# Patient Record
Sex: Female | Born: 1950 | Race: White | Hispanic: No | Marital: Married | Smoking: Never smoker
Health system: Southern US, Community
[De-identification: ages and names within clinical notes are randomized; demographics above are authoritative.]

## PROBLEM LIST (undated history)

## (undated) DIAGNOSIS — Z9889 Other specified postprocedural states: Secondary | ICD-10-CM

## (undated) DIAGNOSIS — E785 Hyperlipidemia, unspecified: Secondary | ICD-10-CM

## (undated) DIAGNOSIS — Z974 Presence of external hearing-aid: Secondary | ICD-10-CM

## (undated) DIAGNOSIS — C50111 Malignant neoplasm of central portion of right female breast: Principal | ICD-10-CM

## (undated) DIAGNOSIS — J302 Other seasonal allergic rhinitis: Secondary | ICD-10-CM

## (undated) DIAGNOSIS — Z973 Presence of spectacles and contact lenses: Secondary | ICD-10-CM

## (undated) DIAGNOSIS — K219 Gastro-esophageal reflux disease without esophagitis: Secondary | ICD-10-CM

## (undated) DIAGNOSIS — F329 Major depressive disorder, single episode, unspecified: Secondary | ICD-10-CM

## (undated) DIAGNOSIS — Z9289 Personal history of other medical treatment: Secondary | ICD-10-CM

## (undated) DIAGNOSIS — C50919 Malignant neoplasm of unspecified site of unspecified female breast: Secondary | ICD-10-CM

## (undated) DIAGNOSIS — M8580 Other specified disorders of bone density and structure, unspecified site: Secondary | ICD-10-CM

## (undated) DIAGNOSIS — F32A Depression, unspecified: Secondary | ICD-10-CM

## (undated) HISTORY — PX: TONSILLECTOMY: SUR1361

## (undated) HISTORY — PX: HAND SURGERY: SHX662

## (undated) HISTORY — DX: Other specified postprocedural states: Z98.890

## (undated) HISTORY — PX: ABDOMINAL HYSTERECTOMY: SHX81

## (undated) HISTORY — DX: Personal history of other medical treatment: Z92.89

## (undated) HISTORY — PX: WISDOM TOOTH EXTRACTION: SHX21

## (undated) HISTORY — DX: Malignant neoplasm of unspecified site of unspecified female breast: C50.919

## (undated) HISTORY — DX: Malignant neoplasm of central portion of right female breast: C50.111

## (undated) HISTORY — PX: FOOT SURGERY: SHX648

## (undated) HISTORY — PX: TUBAL LIGATION: SHX77

## (undated) HISTORY — PX: COLONOSCOPY: SHX174

---

## 1991-08-10 DIAGNOSIS — Z9289 Personal history of other medical treatment: Secondary | ICD-10-CM

## 1991-08-10 HISTORY — DX: Personal history of other medical treatment: Z92.89

## 2000-06-20 ENCOUNTER — Other Ambulatory Visit: Admission: RE | Admit: 2000-06-20 | Discharge: 2000-06-20 | Payer: Self-pay | Admitting: Obstetrics and Gynecology

## 2000-09-26 ENCOUNTER — Other Ambulatory Visit: Admission: RE | Admit: 2000-09-26 | Discharge: 2000-09-26 | Payer: Self-pay | Admitting: Obstetrics and Gynecology

## 2000-11-08 ENCOUNTER — Other Ambulatory Visit: Admission: RE | Admit: 2000-11-08 | Discharge: 2000-11-08 | Payer: Self-pay | Admitting: Obstetrics and Gynecology

## 2001-06-19 ENCOUNTER — Other Ambulatory Visit: Admission: RE | Admit: 2001-06-19 | Discharge: 2001-06-19 | Payer: Self-pay | Admitting: Obstetrics and Gynecology

## 2001-11-27 ENCOUNTER — Other Ambulatory Visit: Admission: RE | Admit: 2001-11-27 | Discharge: 2001-11-27 | Payer: Self-pay | Admitting: *Deleted

## 2002-06-22 ENCOUNTER — Other Ambulatory Visit: Admission: RE | Admit: 2002-06-22 | Discharge: 2002-06-22 | Payer: Self-pay | Admitting: Obstetrics and Gynecology

## 2003-06-28 ENCOUNTER — Other Ambulatory Visit: Admission: RE | Admit: 2003-06-28 | Discharge: 2003-06-28 | Payer: Self-pay | Admitting: Obstetrics and Gynecology

## 2004-06-29 ENCOUNTER — Other Ambulatory Visit: Admission: RE | Admit: 2004-06-29 | Discharge: 2004-06-29 | Payer: Self-pay | Admitting: Obstetrics and Gynecology

## 2004-09-28 ENCOUNTER — Ambulatory Visit (HOSPITAL_COMMUNITY): Admission: RE | Admit: 2004-09-28 | Discharge: 2004-09-28 | Payer: Self-pay | Admitting: Gastroenterology

## 2005-07-14 ENCOUNTER — Other Ambulatory Visit: Admission: RE | Admit: 2005-07-14 | Discharge: 2005-07-14 | Payer: Self-pay | Admitting: Obstetrics and Gynecology

## 2005-12-27 ENCOUNTER — Emergency Department: Payer: Self-pay | Admitting: Emergency Medicine

## 2005-12-27 ENCOUNTER — Other Ambulatory Visit: Payer: Self-pay

## 2006-08-10 ENCOUNTER — Other Ambulatory Visit: Admission: RE | Admit: 2006-08-10 | Discharge: 2006-08-10 | Payer: Self-pay | Admitting: Obstetrics and Gynecology

## 2007-05-11 IMAGING — CR DG CHEST 1V PORT
1 series · 1 of 1 positions shown · non-contrast
Comparison: none

REASON FOR EXAM: Chest pain.  [HOSPITAL]
COMMENTS:

PROCEDURE:     DXR - DXR PORTABLE CHEST SINGLE VIEW  - December 27, 2005  [DATE]
RESULT:     AP view of the chest shows the lung fields to be clear.  The
heart, mediastinal, and osseous structures show no significant abnormalities.

[view not recorded]
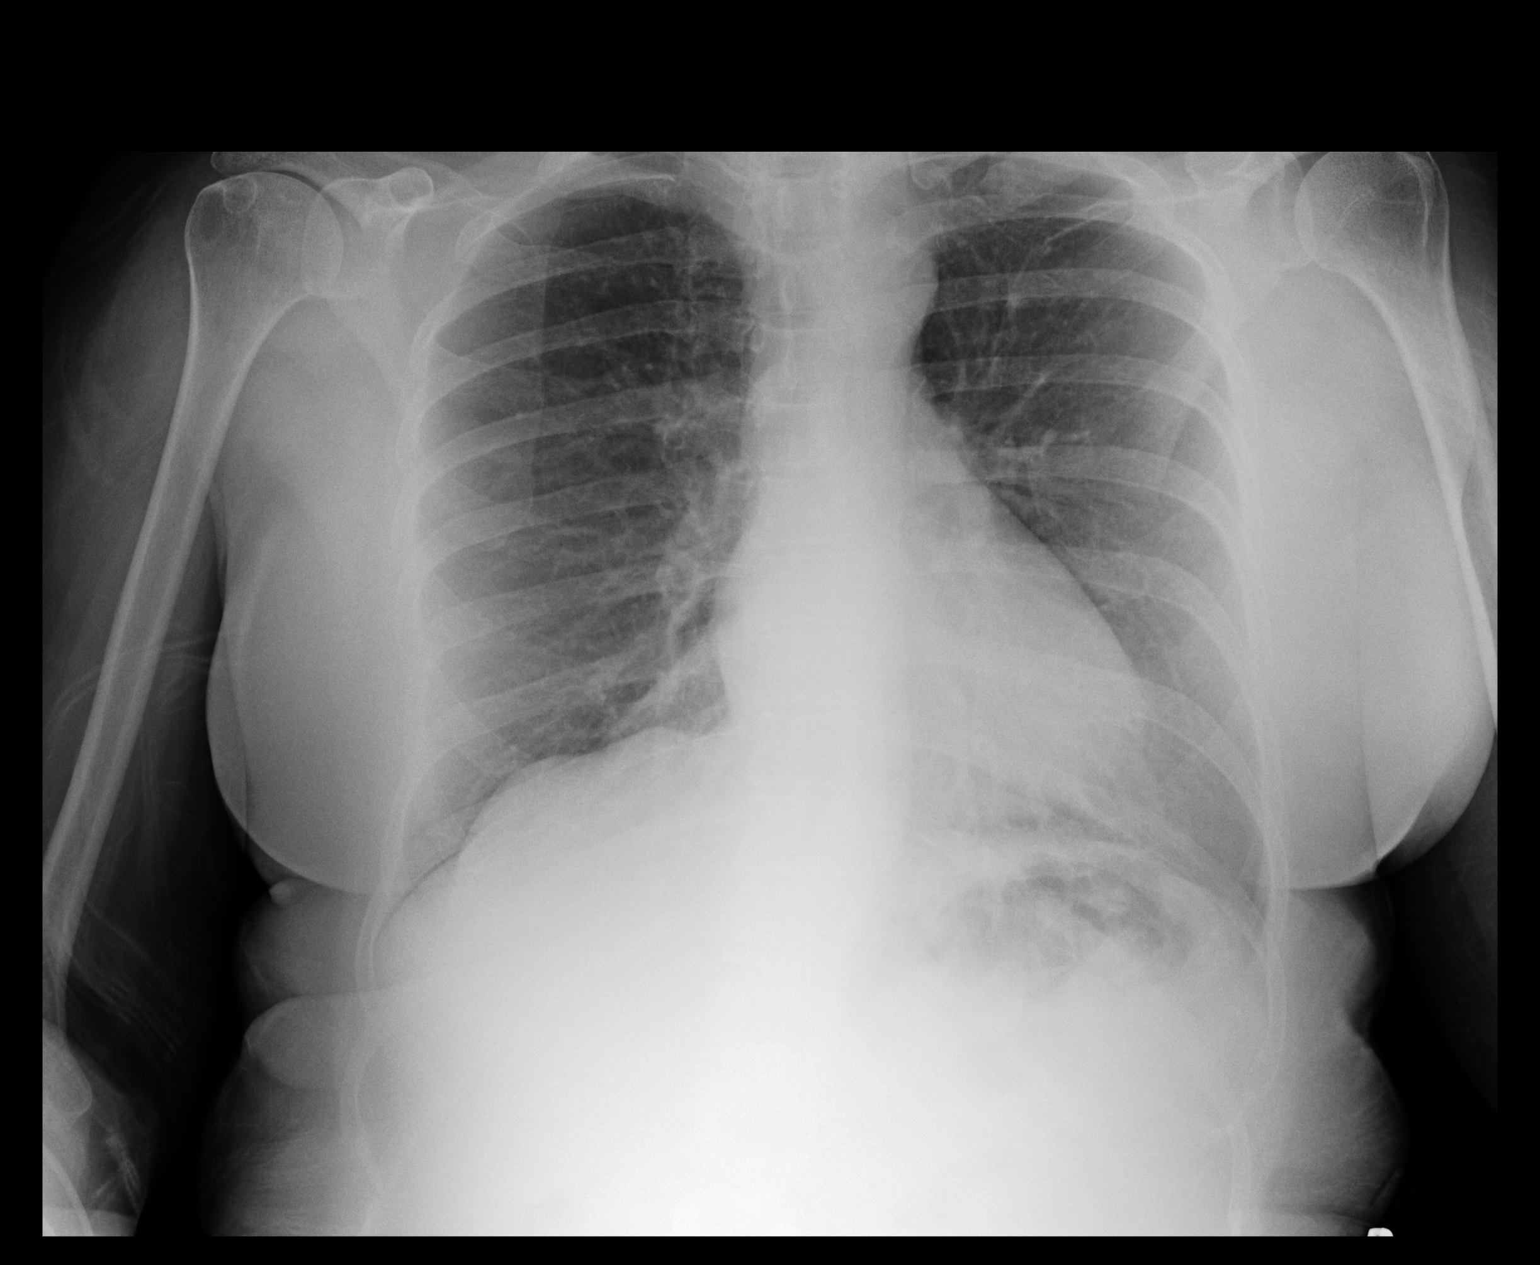

[1 of 1 positions shown; findings below may reference images not displayed]

IMPRESSION: No acute changes are identified.

## 2008-02-12 ENCOUNTER — Ambulatory Visit: Payer: Self-pay | Admitting: Chiropractor

## 2010-08-09 DIAGNOSIS — Z9889 Other specified postprocedural states: Secondary | ICD-10-CM

## 2010-08-09 HISTORY — DX: Other specified postprocedural states: Z98.890

## 2014-05-09 ENCOUNTER — Ambulatory Visit (INDEPENDENT_AMBULATORY_CARE_PROVIDER_SITE_OTHER): Payer: Self-pay | Admitting: Surgery

## 2014-05-09 NOTE — H&P (Signed)
Melody Harding 05/09/2014 11:21 AM Location: [facility] Patient #: [id number] DOB: Nov 22, 1950 Married / Language: Undefined / Race: Undefined Female History of Present Illness [name] A. Jesse Nosbisch MD; 05/09/2014 12:13 PM) Patient words: pre-op breast  Patient sent at the request of Dr. Victorino Sparrow left nipple discharge for one month. Discharge is clear. It only occurs at night. Denies breast pain or mass.No previous history of breast trouble  mammogram and U/S and ductogram negative.  The patient is a 63 year old female who presents with a complaint of nipple discharge. The onset of the nipple discharge has been gradual and has been occurring in an intermittent pattern for 3 weeks. The course has been constant. The nipple discharge is characterized as clear. There are no changes to the nipple. Other Problems (Dahionnarah [place], [place]; 05/09/2014 11:21 AM) Back Pain Depression Gastroesophageal Reflux Disease Hemorrhoids Hypercholesterolemia  Past Surgical History (Dahionnarah [place], RMA; 05/09/2014 11:21 AM) Colon Polyp Removal - Colonoscopy Foot Surgery Bilateral. Hysterectomy (not due to cancer) - Partial Oral Surgery Tonsillectomy  Diagnostic Studies History Alexander Bergeron [place], [place]; 05/09/2014 11:21 AM) Colonoscopy 1-5 years ago Mammogram within last year Pap Smear >5 years ago  Medication History ([place], [place]; 05/09/2014 11:25 AM) ZyrTEC Allergy (10MG  Capsule, Oral) Active. ZyrTEC-D Allergy & Congestion (5-120MG  Tablet ER 12HR, Oral) Active. Singulair (10MG  Tablet, Oral) Active. NexIUM (40MG  Packet, Oral) Active. Lexapro (20MG  Tablet, Oral) Active. Crestor (20MG  Tablet, Oral) Active.  Social History (Dahionnarah [place], [place]; 05/09/2014 11:21 AM) Alcohol use Occasional alcohol use. Caffeine use Tea. No drug use Tobacco use Never smoker.  Family History Alexander Bergeron [place], [place]; 05/09/2014 11:21  AM) Depression Father. Diabetes Mellitus Brother. Heart Disease Father. Malignant Neoplasm Of Pancreas Mother.  Pregnancy / Birth History Alexander Bergeron [place], [place]; 05/09/2014 11:21 AM) Age at menarche 40 years. Age of menopause 83-50 Contraceptive History Oral contraceptives. Gravida 1 Maternal age 34-30 Para 1     Review of Systems ([place] RMA; 05/09/2014 11:21 AM) General Not Present- Appetite Loss, Chills, Fatigue, Fever, Night Sweats, Weight Gain and Weight Loss. Skin Not Present- Change in Wart/Mole, Dryness, Hives, Jaundice, New Lesions, Non-Healing Wounds, Rash and Ulcer. HEENT Present- Hearing Loss, Seasonal Allergies and Wears glasses/contact lenses. Not Present- Earache, Hoarseness, Nose Bleed, Oral Ulcers, Ringing in the Ears, Sinus Pain, Sore Throat, Visual Disturbances and Yellow Eyes. Respiratory Present- Snoring. Not Present- Bloody sputum, Chronic Cough, Difficulty Breathing and Wheezing. Breast Present- Nipple Discharge. Not Present- Breast Mass, Breast Pain and Skin Changes. Cardiovascular Present- Leg Cramps. Not Present- Chest Pain, Difficulty Breathing Lying Down, Palpitations, Rapid Heart Rate, Shortness of Breath and Swelling of Extremities. Gastrointestinal Present- Indigestion. Not Present- Abdominal Pain, Bloating, Bloody Stool, Change in Bowel Habits, Chronic diarrhea, Constipation, Difficulty Swallowing, Excessive gas, Gets full quickly at meals, Hemorrhoids, Nausea, Rectal Pain and Vomiting. Female Genitourinary Not Present- Frequency, Nocturia, Painful Urination, Pelvic Pain and Urgency. Musculoskeletal Present- Back Pain. Not Present- Joint Pain, Joint Stiffness, Muscle Pain, Muscle Weakness and Swelling of Extremities. Neurological Not Present- Decreased Memory, Fainting, Headaches, Numbness, Seizures, Tingling, Tremor, Trouble walking and Weakness. Psychiatric Present- Depression. Not Present- Anxiety, Bipolar, Change in Sleep  Pattern, Fearful and Frequent crying. Endocrine Not Present- Cold Intolerance, Excessive Hunger, Hair Changes, Heat Intolerance, Hot flashes and New Diabetes. Hematology Not Present- Easy Bruising, Excessive bleeding, Gland problems, HIV and Persistent Infections.  Vitals ([name] RMA; 05/09/2014 11:23 AM) 05/09/2014 11:21 AM Weight: 178.8 lb Height: 60in Body Surface Area: 1.85 m Body Mass Index: 34.92 kg/m Temp.: 98.67F(Oral)  Pulse:  80 (Regular)  BP: 138/76 (Sitting, Left Arm, Standard)     Physical Exam (Harryette Shuart A. Sharnelle Cappelli MD; 05/09/2014 12:12 PM)  General Mental Status-Alert. General Appearance-Consistent with stated age. Hydration-Well hydrated. Voice-Normal.  Head and Neck Head-normocephalic, atraumatic with no lesions or palpable masses.  Eye Eyeball - Bilateral-Extraocular movements intact. Sclera/Conjunctiva - Bilateral-No scleral icterus.  Chest and Lung Exam Chest and lung exam reveals -quiet, even and easy respiratory effort with no use of accessory muscles and on auscultation, normal breath sounds, no adventitious sounds and normal vocal resonance. Inspection Chest Wall - Normal. Back - normal. Note: clear nipple dischrge. No masses bilaterally. dense breast tissue bilaterally right without discharge    Breast Nipples Discharge - Left - Scant and Watery. Right - None.  Cardiovascular Cardiovascular examination reveals -on palpation PMI is normal in location and amplitude, no palpable S3 or S4. Normal cardiac borders., normal heart sounds, regular rate and rhythm with no murmurs, carotid auscultation reveals no bruits and normal pedal pulses bilaterally.  Neurologic Neurologic evaluation reveals -alert and oriented x 3 with no impairment of recent or remote memory. Mental Status-Normal.  Musculoskeletal Normal Exam - Left-Upper Extremity Strength Normal and Lower Extremity Strength Normal. Normal Exam -  Right-Upper Extremity Strength Normal, Lower Extremity Weakness.    Assessment & Plan (Jenita Rayfield A. Edoardo Laforte MD; 05/09/2014 12:14 PM)  DISCHARGE FROM LEFT NIPPLE (611.79  N64.52) Impression: discussed central duct exciosion vs observation. Risk of malignancy is 3 % or less. pt wishes to proceed. Risk of lumpectomy include bleeding, infection, seroma, more surgery, use of seed/wire, wound care, cosmetic deformity and the need for other treatments, death , blood clots, death. Pt agrees to proceed.

## 2014-06-14 ENCOUNTER — Encounter (HOSPITAL_BASED_OUTPATIENT_CLINIC_OR_DEPARTMENT_OTHER): Payer: Self-pay | Admitting: *Deleted

## 2014-06-14 NOTE — Progress Notes (Signed)
Called pcp for labs done today-go [facility]-so does not need to come in for [facility] labs

## 2014-06-17 DIAGNOSIS — F32A Depression, unspecified: Secondary | ICD-10-CM | POA: Insufficient documentation

## 2014-06-20 ENCOUNTER — Ambulatory Visit (HOSPITAL_BASED_OUTPATIENT_CLINIC_OR_DEPARTMENT_OTHER)
Admission: RE | Admit: 2014-06-20 | Discharge: 2014-06-20 | Disposition: A | Discharge status: Home / Self Care | Payer: BC Managed Care – PPO | Source: Ambulatory Visit | Attending: Surgery | Admitting: Surgery

## 2014-06-20 ENCOUNTER — Encounter (HOSPITAL_BASED_OUTPATIENT_CLINIC_OR_DEPARTMENT_OTHER): Payer: Self-pay | Admitting: Anesthesiology

## 2014-06-20 ENCOUNTER — Encounter (HOSPITAL_BASED_OUTPATIENT_CLINIC_OR_DEPARTMENT_OTHER)
Admission: RE | Disposition: A | Discharge status: Home / Self Care | Payer: Self-pay | Source: Ambulatory Visit | Attending: Surgery

## 2014-06-20 ENCOUNTER — Ambulatory Visit (HOSPITAL_BASED_OUTPATIENT_CLINIC_OR_DEPARTMENT_OTHER): Payer: BC Managed Care – PPO | Admitting: Anesthesiology

## 2014-06-20 DIAGNOSIS — F329 Major depressive disorder, single episode, unspecified: Secondary | ICD-10-CM | POA: Diagnosis not present

## 2014-06-20 DIAGNOSIS — Z79899 Other long term (current) drug therapy: Secondary | ICD-10-CM | POA: Insufficient documentation

## 2014-06-20 DIAGNOSIS — N6452 Nipple discharge: Secondary | ICD-10-CM | POA: Insufficient documentation

## 2014-06-20 DIAGNOSIS — E780 Pure hypercholesterolemia: Secondary | ICD-10-CM | POA: Diagnosis not present

## 2014-06-20 DIAGNOSIS — K219 Gastro-esophageal reflux disease without esophagitis: Secondary | ICD-10-CM | POA: Insufficient documentation

## 2014-06-20 HISTORY — DX: Other seasonal allergic rhinitis: J30.2

## 2014-06-20 HISTORY — DX: Hyperlipidemia, unspecified: E78.5

## 2014-06-20 HISTORY — DX: Presence of external hearing-aid: Z97.4

## 2014-06-20 HISTORY — DX: Gastro-esophageal reflux disease without esophagitis: K21.9

## 2014-06-20 HISTORY — DX: Presence of spectacles and contact lenses: Z97.3

## 2014-06-20 HISTORY — DX: Major depressive disorder, single episode, unspecified: F32.9

## 2014-06-20 HISTORY — PX: BREAST DUCTAL SYSTEM EXCISION: SHX5242

## 2014-06-20 HISTORY — DX: Depression, unspecified: F32.A

## 2014-06-20 SURGERY — EXCISION DUCTAL SYSTEM BREAST
Anesthesia: General | Site: Breast | Laterality: Left

## 2014-06-20 MED ORDER — HYDROMORPHONE HCL 1 MG/ML IJ SOLN: 0.2500 mg | INTRAMUSCULAR | Status: DC | PRN

## 2014-06-20 MED ORDER — LIDOCAINE HCL (CARDIAC) 20 MG/ML IV SOLN
INTRAVENOUS | Status: DC | PRN
  Administered 2014-06-20: 50 mg via INTRAVENOUS

## 2014-06-20 MED ORDER — FENTANYL CITRATE 0.05 MG/ML IJ SOLN: 50.0000 ug | INTRAMUSCULAR | Status: DC | PRN

## 2014-06-20 MED ORDER — HYDROCODONE-ACETAMINOPHEN 5-325 MG PO TABS: 1.0000 | ORAL_TABLET | Freq: Four times a day (QID) | ORAL | Status: DC | PRN

## 2014-06-20 MED ORDER — EPHEDRINE SULFATE 50 MG/ML IJ SOLN
INTRAMUSCULAR | Status: DC | PRN
  Administered 2014-06-20: 10 mg via INTRAVENOUS

## 2014-06-20 MED ORDER — SCOPOLAMINE 1 MG/3DAYS TD PT72
1.0000 | MEDICATED_PATCH | TRANSDERMAL | Status: DC
  Administered 2014-06-20: 1.5 mg via TRANSDERMAL

## 2014-06-20 MED ORDER — OXYCODONE HCL 5 MG/5ML PO SOLN: 5.0000 mg | Freq: Once | ORAL | Status: DC | PRN

## 2014-06-20 MED ORDER — LACTATED RINGERS IV SOLN
INTRAVENOUS | Status: DC
  Administered 2014-06-20 (×2): via INTRAVENOUS

## 2014-06-20 MED ORDER — PROPOFOL 10 MG/ML IV BOLUS
INTRAVENOUS | Status: DC | PRN
  Administered 2014-06-20: 200 mg via INTRAVENOUS

## 2014-06-20 MED ORDER — MIDAZOLAM HCL 5 MG/5ML IJ SOLN
INTRAMUSCULAR | Status: DC | PRN
  Administered 2014-06-20: 2 mg via INTRAVENOUS

## 2014-06-20 MED ORDER — OXYCODONE HCL 5 MG PO TABS: 5.0000 mg | ORAL_TABLET | Freq: Once | ORAL | Status: DC | PRN

## 2014-06-20 MED ORDER — MIDAZOLAM HCL 2 MG/2ML IJ SOLN
INTRAMUSCULAR | Status: AC
  Filled 2014-06-20: qty 2

## 2014-06-20 MED ORDER — ONDANSETRON HCL 4 MG/2ML IJ SOLN: 4.0000 mg | Freq: Once | INTRAMUSCULAR | Status: DC | PRN

## 2014-06-20 MED ORDER — FENTANYL CITRATE 0.05 MG/ML IJ SOLN
INTRAMUSCULAR | Status: AC
  Filled 2014-06-20: qty 4

## 2014-06-20 MED ORDER — FENTANYL CITRATE 0.05 MG/ML IJ SOLN
INTRAMUSCULAR | Status: DC | PRN
  Administered 2014-06-20: 50 ug via INTRAVENOUS

## 2014-06-20 MED ORDER — ONDANSETRON HCL 4 MG/2ML IJ SOLN
INTRAMUSCULAR | Status: DC | PRN
  Administered 2014-06-20: 4 mg via INTRAVENOUS

## 2014-06-20 MED ORDER — DEXAMETHASONE SODIUM PHOSPHATE 4 MG/ML IJ SOLN
INTRAMUSCULAR | Status: DC | PRN
  Administered 2014-06-20: 10 mg via INTRAVENOUS

## 2014-06-20 MED ORDER — BUPIVACAINE-EPINEPHRINE (PF) 0.25% -1:200000 IJ SOLN
INTRAMUSCULAR | Status: AC
  Filled 2014-06-20: qty 30

## 2014-06-20 MED ORDER — SCOPOLAMINE 1 MG/3DAYS TD PT72
MEDICATED_PATCH | TRANSDERMAL | Status: AC
  Filled 2014-06-20: qty 1

## 2014-06-20 MED ORDER — BUPIVACAINE-EPINEPHRINE 0.25% -1:200000 IJ SOLN
INTRAMUSCULAR | Status: DC | PRN
  Administered 2014-06-20: 20 mL

## 2014-06-20 MED ORDER — MIDAZOLAM HCL 2 MG/2ML IJ SOLN: 1.0000 mg | INTRAMUSCULAR | Status: DC | PRN

## 2014-06-20 MED ORDER — CEFAZOLIN SODIUM-DEXTROSE 2-3 GM-% IV SOLR
INTRAVENOUS | Status: AC
  Filled 2014-06-20: qty 50

## 2014-06-20 MED ORDER — DEXTROSE 5 % IV SOLN
3.0000 g | INTRAVENOUS | Status: AC
  Administered 2014-06-20: 2 g via INTRAVENOUS

## 2014-06-20 MED ORDER — CHLORHEXIDINE GLUCONATE 4 % EX LIQD: 1.0000 "application " | Freq: Once | CUTANEOUS | Status: DC

## 2014-06-20 MED ORDER — CEFAZOLIN SODIUM 1-5 GM-% IV SOLN
INTRAVENOUS | Status: AC
  Filled 2014-06-20: qty 50

## 2014-06-20 SURGICAL SUPPLY — 41 items
BLADE SURG 15 STRL LF DISP TIS (BLADE) ×1 IMPLANT
BLADE SURG 15 STRL SS (BLADE) ×2
CANISTER SUCT 1200ML W/VALVE (MISCELLANEOUS) ×2 IMPLANT
CHLORAPREP W/TINT 26ML (MISCELLANEOUS) ×2 IMPLANT
CLIP TI WIDE RED SMALL 6 (CLIP) IMPLANT
COVER BACK TABLE 60X90IN (DRAPES) ×2 IMPLANT
COVER MAYO STAND STRL (DRAPES) ×2 IMPLANT
DECANTER SPIKE VIAL GLASS SM (MISCELLANEOUS) ×1 IMPLANT
DEVICE DUBIN W/COMP PLATE 8390 (MISCELLANEOUS) IMPLANT
DRAPE LAPAROSCOPIC ABDOMINAL (DRAPES) IMPLANT
DRAPE PED LAPAROTOMY (DRAPES) ×2 IMPLANT
DRAPE UTILITY XL STRL (DRAPES) ×2 IMPLANT
ELECT COATED BLADE 2.86 ST (ELECTRODE) ×2 IMPLANT
ELECT REM PT RETURN 9FT ADLT (ELECTROSURGICAL) ×2
ELECTRODE REM PT RTRN 9FT ADLT (ELECTROSURGICAL) ×1 IMPLANT
GLOVE BIOGEL PI IND STRL 7.0 (GLOVE) IMPLANT
GLOVE BIOGEL PI IND STRL 8 (GLOVE) ×1 IMPLANT
GLOVE BIOGEL PI INDICATOR 7.0 (GLOVE) ×1
GLOVE BIOGEL PI INDICATOR 8 (GLOVE) ×1
GLOVE ECLIPSE 6.5 STRL STRAW (GLOVE) ×1 IMPLANT
GLOVE ECLIPSE 8.0 STRL XLNG CF (GLOVE) ×2 IMPLANT
GOWN STRL REUS W/ TWL LRG LVL3 (GOWN DISPOSABLE) ×2 IMPLANT
GOWN STRL REUS W/TWL LRG LVL3 (GOWN DISPOSABLE) ×4
LIQUID BAND (GAUZE/BANDAGES/DRESSINGS) ×2 IMPLANT
NDL HYPO 25X1 1.5 SAFETY (NEEDLE) ×1 IMPLANT
NEEDLE HYPO 25X1 1.5 SAFETY (NEEDLE) ×2 IMPLANT
NS IRRIG 1000ML POUR BTL (IV SOLUTION) ×2 IMPLANT
PACK BASIN DAY SURGERY FS (CUSTOM PROCEDURE TRAY) ×2 IMPLANT
PENCIL BUTTON HOLSTER BLD 10FT (ELECTRODE) ×2 IMPLANT
SLEEVE SCD COMPRESS KNEE MED (MISCELLANEOUS) ×2 IMPLANT
SPONGE LAP 4X18 X RAY DECT (DISPOSABLE) ×2 IMPLANT
STAPLER VISISTAT 35W (STAPLE) IMPLANT
SUT MON AB 4-0 PC3 18 (SUTURE) ×2 IMPLANT
SUT SILK 2 0 SH (SUTURE) IMPLANT
SUT VIC AB 3-0 SH 27 (SUTURE) ×4
SUT VIC AB 3-0 SH 27X BRD (SUTURE) ×1 IMPLANT
SYR CONTROL 10ML LL (SYRINGE) ×2 IMPLANT
TOWEL OR 17X24 6PK STRL BLUE (TOWEL DISPOSABLE) ×4 IMPLANT
TOWEL OR NON WOVEN STRL DISP B (DISPOSABLE) ×2 IMPLANT
TUBE CONNECTING 20X1/4 (TUBING) ×2 IMPLANT
YANKAUER SUCT BULB TIP NO VENT (SUCTIONS) ×2 IMPLANT

## 2014-06-20 NOTE — Anesthesia Postprocedure Evaluation (Signed)
  Anesthesia Post-op Note  Patient: Melody Harding  Procedure(s) Performed: Procedure(s): LEFT BREAST CENTRAL DUCT EXCISION (Left)  Patient Location: PACU  Anesthesia Type: General   Level of Consciousness: awake, alert  and oriented  Airway and Oxygen Therapy: Patient Spontanous Breathing  Post-op Pain: mild  Post-op Assessment: Post-op Vital signs reviewed  Post-op Vital Signs: Reviewed  Last Vitals:  Filed Vitals:   06/20/14 1145  BP: 157/88  Pulse: 71  Temp:   Resp: 12    Complications: No apparent anesthesia complications

## 2014-06-20 NOTE — Discharge Instructions (Signed)
[facility] °Office Phone Number [phone] ° °BREAST BIOPSY/ PARTIAL MASTECTOMY: POST OP INSTRUCTIONS ° °Always review your discharge instruction sheet given to you by the facility where your surgery was performed. ° °IF YOU HAVE DISABILITY OR FAMILY LEAVE FORMS, YOU MUST BRING THEM TO THE OFFICE FOR PROCESSING.  DO NOT GIVE THEM TO YOUR DOCTOR. ° °1. A prescription for pain medication may be given to you upon discharge.  Take your pain medication as prescribed, if needed.  If narcotic pain medicine is not needed, then you may take acetaminophen (Tylenol) or ibuprofen (Advil) as needed. °2. Take your usually prescribed medications unless otherwise directed °3. If you need a refill on your pain medication, please contact your pharmacy.  They will contact our office to request authorization.  Prescriptions will not be filled after 5pm or on week-ends. °4. You should eat very light the first 24 hours after surgery, such as soup, crackers, pudding, etc.  Resume your normal diet the day after surgery. °5. Most patients will experience some swelling and bruising in the breast.  Ice packs and a good support bra will help.  Swelling and bruising can take several days to resolve.  °6. It is common to experience some constipation if taking pain medication after surgery.  Increasing fluid intake and taking a stool softener will usually help or prevent this problem from occurring.  A mild laxative (Milk of Magnesia or Miralax) should be taken according to package directions if there are no bowel movements after 48 hours. °7. Unless discharge instructions indicate otherwise, you may remove your bandages 24-48 hours after surgery, and you may shower at that time.  You may have steri-strips (small skin tapes) in place directly over the incision.  These strips should be left on the skin for 7-10 days.  If your surgeon used skin glue on the incision, you may shower in 24 hours.  The glue will flake off over the  next 2-3 weeks.  Any sutures or staples will be removed at the office during your follow-up visit. °8. ACTIVITIES:  You may resume regular daily activities (gradually increasing) beginning the next day.  Wearing a good support bra or sports bra minimizes pain and swelling.  You may have sexual intercourse when it is comfortable. °a. You may drive when you no longer are taking prescription pain medication, you can comfortably wear a seatbelt, and you can safely maneuver your car and apply brakes. °b. RETURN TO WORK:  ______________________________________________________________________________________ °9. You should see your doctor in the office for a follow-up appointment approximately two weeks after your surgery.  Your doctor’s nurse will typically make your follow-up appointment when she calls you with your pathology report.  Expect your pathology report 2-3 business days after your surgery.  You may call to check if you do not hear from us after three days. °10. OTHER INSTRUCTIONS: _______________________________________________________________________________________________ _____________________________________________________________________________________________________________________________________ °_____________________________________________________________________________________________________________________________________ °_____________________________________________________________________________________________________________________________________ ° °WHEN TO CALL YOUR DOCTOR: °1. Fever over 101.0 °2. Nausea and/or vomiting. °3. Extreme swelling or bruising. °4. Continued bleeding from incision. °5. Increased pain, redness, or drainage from the incision. ° °The clinic staff is available to answer your questions during regular business hours.  Please don’t hesitate to call and ask to speak to one of the nurses for clinical concerns.  If you have a medical emergency, go to the nearest  emergency room or call 911.  A surgeon from [facility] is always on call at the hospital. ° °For further questions, please visit [email or web address]  ° ° °  Post Anesthesia Home Care Instructions ° °Activity: °Get plenty of rest for the remainder of the day. A responsible adult should stay with you for 24 hours following the procedure.  °For the next 24 hours, DO NOT: °-Drive a car °-Operate machinery °-Drink alcoholic beverages °-Take any medication unless instructed by your physician °-Make any legal decisions or sign important papers. ° °Meals: °Start with liquid foods such as gelatin or soup. Progress to regular foods as tolerated. Avoid greasy, spicy, heavy foods. If nausea and/or vomiting occur, drink only clear liquids until the nausea and/or vomiting subsides. Call your physician if vomiting continues. ° °Special Instructions/Symptoms: °Your throat may feel dry or sore from the anesthesia or the breathing tube placed in your throat during surgery. If this causes discomfort, gargle with warm salt water. The discomfort should disappear within 24 hours. ° °

## 2014-06-20 NOTE — Transfer of Care (Signed)
Immediate Anesthesia Transfer of Care Note  Patient: Melody Harding  Procedure(s) Performed: Procedure(s): LEFT BREAST CENTRAL DUCT EXCISION (Left)  Patient Location: PACU  Anesthesia Type:General  Level of Consciousness: sedated  Airway & Oxygen Therapy: Patient Spontanous Breathing and Patient connected to face mask oxygen  Post-op Assessment: Report given to PACU RN and Post -op Vital signs reviewed and stable  Post vital signs: Reviewed and stable  Complications: No apparent anesthesia complications

## 2014-06-20 NOTE — Interval H&P Note (Signed)
History and Physical Interval Note:  06/20/2014 10:07 AM  Melody Harding  has presented today for surgery, with the diagnosis of Nipple Discharge  The various methods of treatment have been discussed with the patient and family. After consideration of risks, benefits and other options for treatment, the patient has consented to  Procedure(s): LEFT BREAST CENTRAL DUCT EXCISION (Left) as a surgical intervention .  The patient's history has been reviewed, patient examined, no change in status, stable for surgery.  I have reviewed the patient's chart and labs.  Questions were answered to the patient's satisfaction.     Ole Lafon A.

## 2014-06-20 NOTE — Anesthesia Procedure Notes (Signed)
Procedure Name: LMA Insertion Date/Time: 06/20/2014 10:34 AM Performed by: Lieutenant Diego Pre-anesthesia Checklist: Patient identified, Emergency Drugs available, Suction available and Patient being monitored Patient Re-evaluated:Patient Re-evaluated prior to inductionOxygen Delivery Method: Circle System Utilized Preoxygenation: Pre-oxygenation with 100% oxygen Intubation Type: IV induction Ventilation: Mask ventilation without difficulty LMA: LMA inserted LMA Size: 4.0 Number of attempts: 1 Airway Equipment and Method: bite block Placement Confirmation: positive ETCO2 and breath sounds checked- equal and bilateral Tube secured with: Tape Dental Injury: Teeth and Oropharynx as per pre-operative assessment

## 2014-06-20 NOTE — Anesthesia Preprocedure Evaluation (Signed)
Anesthesia Evaluation  Patient identified by MRN, date of birth, ID band Patient awake    Reviewed: Allergy & Precautions, H&P , NPO status , Patient's Chart, lab work & pertinent test results  Airway Mallampati: II  TM Distance: >3 FB Neck ROM: Full    Dental  (+) Teeth Intact, Dental Advisory Given   Pulmonary  breath sounds clear to auscultation        Cardiovascular Rhythm:Regular Rate:Normal     Neuro/Psych    GI/Hepatic GERD-  Medicated and Controlled,  Endo/Other  Morbid obesity  Renal/GU      Musculoskeletal   Abdominal   Peds  Hematology   Anesthesia Other Findings   Reproductive/Obstetrics                             Anesthesia Physical Anesthesia Plan  ASA: II  Anesthesia Plan: General   Post-op Pain Management:    Induction: Intravenous  Airway Management Planned: LMA  Additional Equipment:   Intra-op Plan:   Post-operative Plan: Extubation in OR  Informed Consent: I have reviewed the patients History and Physical, chart, labs and discussed the procedure including the risks, benefits and alternatives for the proposed anesthesia with the patient or authorized representative who has indicated his/her understanding and acceptance.   Dental advisory given  Plan Discussed with: CRNA, Anesthesiologist and Surgeon  Anesthesia Plan Comments:         Anesthesia Quick Evaluation

## 2014-06-20 NOTE — Op Note (Signed)
Preop diagnosis: left breast  nipple discharge  Postop diagnosis: Same  Procedure: Central breast duct excision left   Surgeon: Erroll Luna M.D.  Anesthesia: Gen. LMA l with local anesthesia 0.25 % SENSORICAINE WITH EPINEPHRINE   EBL: Minimal  Specimen:  LEFT Breast tissue to pathology  Drains: None  Indications for procedure: The patient presents for central duct excision of the breast due to nipple discharge. Workup included mammography and ultrasound. A filling defect was NOT identified on workup  in the central ducts of the breast. Discussion of operative and nonoperative means were done with the patient. Risks of operative excision include bleeding, infection, recurrence, and the need for the surgery, seroma, anesthesia risks. Observation is the other choice discussed. The patient wishes to proceed for excision due to small but finite risk of malignancy.   Description of procedure: The patient was seen all the holding  area. Questions are answered. The breast was marked. The patient was taken back to the operating room. The patient was placed supine on the operating room table. General anesthesia was initiated. Upper chest was prepped and draped in a sterile fashion. A timeout was done. She received preoperative antibiotics within an hour of incision. A curvilinear incision was made along the border of the nipple. The nipple was elevated carefully. The central duct was identified using  a probe. The duct and neighboring tissue were excised. This was sent to pathology. The wound was irrigated and then closed with 3-0 Vicryl and 4-0 Monocryl in a subcuticular fashion Dermabond was applied. Patient was awoke and taken to recovery in satisfactory condition. All final counts the sponge, instruments and needles were correct.

## 2014-06-20 NOTE — H&P (Signed)
H&P   Melody Harding (MR# [id number])      H&P Info    Author Note Status Last Update User Last Update Date/Time   Erroll Luna, MD Signed Erroll Luna, MD 05/09/2014 12:21 PM    H&P    Expand All Collapse All    Melody Harding 05/09/2014 11:21 AM Location: [facility] Patient #: [id number] DOB: November 02, 1950 Married / Language: Undefined / Race: Undefined Female History of Present Illness Melody Moores A. Fareed Fung MD; 05/09/2014 12:13 PM) Patient words: pre-op breast  Patient sent at the request of Dr. Victorino Sparrow left nipple discharge for one month. Discharge is clear. It only occurs at night. Denies breast pain or mass.No previous history of breast trouble  mammogram and U/S and ductogram negative.  The patient is a 63 year old female who presents with a complaint of nipple discharge. The onset of the nipple discharge has been gradual and has been occurring in an intermittent pattern for 3 weeks. The course has been constant. The nipple discharge is characterized as clear. There are no changes to the nipple. Other Problems ([name], [place]; 05/09/2014 11:21 AM) Back Pain Depression Gastroesophageal Reflux Disease Hemorrhoids Hypercholesterolemia  Past Surgical History ([name], RMA; 05/09/2014 11:21 AM) Colon Polyp Removal - Colonoscopy Foot Surgery Bilateral. Hysterectomy (not due to cancer) - Partial Oral Surgery Tonsillectomy  Diagnostic Studies History [name], [place]; 05/09/2014 11:21 AM) Colonoscopy 1-5 years ago Mammogram within last year Pap Smear >5 years ago  Medication History ([name], [name]; 05/09/2014 11:25 AM) ZyrTEC Allergy (10MG  Capsule, Oral) Active. ZyrTEC-D Allergy & Congestion (5-120MG  Tablet ER 12HR, Oral) Active. Singulair (10MG  Tablet, Oral) Active. NexIUM (40MG  Packet, Oral) Active. Lexapro (20MG  Tablet, Oral) Active. Crestor (20MG  Tablet, Oral) Active.  Social  History ([name], [place]; 05/09/2014 11:21 AM) Alcohol use Occasional alcohol use. Caffeine use Tea. No drug use Tobacco use Never smoker.  Family History Alexander Bergeron [place], [place]; 05/09/2014 11:21 AM) Depression Father. Diabetes Mellitus Brother. Heart Disease Father. Malignant Neoplasm Of Pancreas Mother.  Pregnancy / Birth History Alexander Bergeron [place], [place]; 05/09/2014 11:21 AM) Age at menarche 24 years. Age of menopause 29-50 Contraceptive History Oral contraceptives. Gravida 1 Maternal age 29-30 Para 1     Review of Systems ([place] RMA; 05/09/2014 11:21 AM) General Not Present- Appetite Loss, Chills, Fatigue, Fever, Night Sweats, Weight Gain and Weight Loss. Skin Not Present- Change in Wart/Mole, Dryness, Hives, Jaundice, New Lesions, Non-Healing Wounds, Rash and Ulcer. HEENT Present- Hearing Loss, Seasonal Allergies and Wears glasses/contact lenses. Not Present- Earache, Hoarseness, Nose Bleed, Oral Ulcers, Ringing in the Ears, Sinus Pain, Sore Throat, Visual Disturbances and Yellow Eyes. Respiratory Present- Snoring. Not Present- Bloody sputum, Chronic Cough, Difficulty Breathing and Wheezing. Breast Present- Nipple Discharge. Not Present- Breast Mass, Breast Pain and Skin Changes. Cardiovascular Present- Leg Cramps. Not Present- Chest Pain, Difficulty Breathing Lying Down, Palpitations, Rapid Heart Rate, Shortness of Breath and Swelling of Extremities. Gastrointestinal Present- Indigestion. Not Present- Abdominal Pain, Bloating, Bloody Stool, Change in Bowel Habits, Chronic diarrhea, Constipation, Difficulty Swallowing, Excessive gas, Gets full quickly at meals, Hemorrhoids, Nausea, Rectal Pain and Vomiting. Female Genitourinary Not Present- Frequency, Nocturia, Painful Urination, Pelvic Pain and Urgency. Musculoskeletal Present- Back Pain. Not Present- Joint Pain, Joint Stiffness, Muscle Pain, Muscle Weakness and Swelling of  Extremities. Neurological Not Present- Decreased Memory, Fainting, Headaches, Numbness, Seizures, Tingling, Tremor, Trouble walking and Weakness. Psychiatric Present- Depression. Not Present- Anxiety, Bipolar, Change in Sleep Pattern, Fearful and Frequent crying. Endocrine Not Present- Cold Intolerance, Excessive  Hunger, Hair Changes, Heat Intolerance, Hot flashes and New Diabetes. Hematology Not Present- Easy Bruising, Excessive bleeding, Gland problems, HIV and Persistent Infections.  Vitals ([name] RMA; 05/09/2014 11:23 AM) 05/09/2014 11:21 AM Weight: 178.8 lb Height: 60in Body Surface Area: 1.85 m Body Mass Index: 34.92 kg/m Temp.: 98.66F(Oral)  Pulse: 80 (Regular)  BP: 138/76 (Sitting, Left Arm, Standard)     Physical Exam (Jonell Brumbaugh A. Kimberl Vig MD; 05/09/2014 12:12 PM)  General Mental Status-Alert. General Appearance-Consistent with stated age. Hydration-Well hydrated. Voice-Normal.  Head and Neck Head-normocephalic, atraumatic with no lesions or palpable masses.  Eye Eyeball - Bilateral-Extraocular movements intact. Sclera/Conjunctiva - Bilateral-No scleral icterus.  Chest and Lung Exam Chest and lung exam reveals -quiet, even and easy respiratory effort with no use of accessory muscles and on auscultation, normal breath sounds, no adventitious sounds and normal vocal resonance. Inspection Chest Wall - Normal. Back - normal. Note: clear nipple dischrge. No masses bilaterally. dense breast tissue bilaterally right without discharge    Breast Nipples Discharge - Left - Scant and Watery. Right - None.  Cardiovascular Cardiovascular examination reveals -on palpation PMI is normal in location and amplitude, no palpable S3 or S4. Normal cardiac borders., normal heart sounds, regular rate and rhythm with no murmurs, carotid auscultation reveals no bruits and normal pedal pulses bilaterally.  Neurologic Neurologic evaluation  reveals -alert and oriented x 3 with no impairment of recent or remote memory. Mental Status-Normal.  Musculoskeletal Normal Exam - Left-Upper Extremity Strength Normal and Lower Extremity Strength Normal. Normal Exam - Right-Upper Extremity Strength Normal, Lower Extremity Weakness.    Assessment & Plan ([place]itt[place] Renaldo A. Sylvia Kondracki MD; 05/09/2014 12:14 PM)  DISCHARGE FROM LEFT NIPPLE (611.79  N64.52) Impression: discussed central duct exciosion vs observation. Risk of malignancy is 3 % or less. pt wishes to proceed. Risk of lumpectomy include bleeding, infection, seroma, more surgery, use of seed/wire, wound care, cosmetic deformity and the need for other treatments, death , blood clots, death. Pt agrees to proceed.

## 2014-06-21 ENCOUNTER — Encounter (HOSPITAL_BASED_OUTPATIENT_CLINIC_OR_DEPARTMENT_OTHER): Payer: Self-pay | Admitting: Surgery

## 2015-10-06 ENCOUNTER — Other Ambulatory Visit: Payer: Self-pay | Admitting: Radiology

## 2015-10-10 ENCOUNTER — Encounter: Payer: Self-pay | Admitting: *Deleted

## 2015-10-10 ENCOUNTER — Telehealth: Payer: Self-pay | Admitting: *Deleted

## 2015-10-10 DIAGNOSIS — C50111 Malignant neoplasm of central portion of right female breast: Secondary | ICD-10-CM

## 2015-10-10 DIAGNOSIS — C50411 Malignant neoplasm of upper-outer quadrant of right female breast: Secondary | ICD-10-CM | POA: Insufficient documentation

## 2015-10-10 HISTORY — DX: Malignant neoplasm of central portion of right female breast: C50.111

## 2015-10-10 NOTE — Telephone Encounter (Signed)
Confirmed [facility] for 10/15/15 at 1215 .  Instructions and contact information given.

## 2015-10-15 ENCOUNTER — Encounter: Payer: Self-pay | Admitting: Nurse Practitioner

## 2015-10-15 ENCOUNTER — Ambulatory Visit: Payer: BC Managed Care – PPO | Admitting: Physical Therapy

## 2015-10-15 ENCOUNTER — Other Ambulatory Visit (HOSPITAL_BASED_OUTPATIENT_CLINIC_OR_DEPARTMENT_OTHER): Payer: BC Managed Care – PPO

## 2015-10-15 ENCOUNTER — Encounter: Payer: Self-pay | Admitting: Hematology and Oncology

## 2015-10-15 ENCOUNTER — Other Ambulatory Visit: Payer: Self-pay | Admitting: General Surgery

## 2015-10-15 ENCOUNTER — Ambulatory Visit
Admission: RE | Admit: 2015-10-15 | Discharge: 2015-10-15 | Disposition: A | Discharge status: Home / Self Care | Payer: BC Managed Care – PPO | Source: Ambulatory Visit | Attending: Radiation Oncology | Admitting: Radiation Oncology

## 2015-10-15 ENCOUNTER — Ambulatory Visit (HOSPITAL_BASED_OUTPATIENT_CLINIC_OR_DEPARTMENT_OTHER): Payer: BC Managed Care – PPO | Admitting: Hematology and Oncology

## 2015-10-15 VITALS — BP 122/78 | HR 70 | Temp 98.0°F | Resp 18 | Ht 60.0 in | Wt 183.3 lb

## 2015-10-15 DIAGNOSIS — Z170 Estrogen receptor positive status [ER+]: Secondary | ICD-10-CM

## 2015-10-15 DIAGNOSIS — C50111 Malignant neoplasm of central portion of right female breast: Secondary | ICD-10-CM

## 2015-10-15 DIAGNOSIS — D0511 Intraductal carcinoma in situ of right breast: Secondary | ICD-10-CM

## 2015-10-15 LAB — COMPREHENSIVE METABOLIC PANEL
ALT: 12 U/L (ref 0–55)
AST: 13 U/L (ref 5–34)
Albumin: 3.8 g/dL (ref 3.5–5.0)
Alkaline Phosphatase: 86 U/L (ref 40–150)
Anion Gap: 9 mEq/L (ref 3–11)
BUN: 12.5 mg/dL (ref 7.0–26.0)
CHLORIDE: 103 meq/L (ref 98–109)
CO2: 27 meq/L (ref 22–29)
Calcium: 9.3 mg/dL (ref 8.4–10.4)
Creatinine: 0.9 mg/dL (ref 0.6–1.1)
EGFR: 72 mL/min/{1.73_m2} — ABNORMAL LOW (ref >90)
GLUCOSE: 88 mg/dL (ref 70–140)
POTASSIUM: 4 meq/L (ref 3.5–5.1)
SODIUM: 138 meq/L (ref 136–145)
Total Bilirubin: <0.3 mg/dL (ref 0.20–1.20)
Total Protein: 6.8 g/dL (ref 6.4–8.3)

## 2015-10-15 LAB — CBC WITH DIFFERENTIAL/PLATELET
BASO%: 0.8 % (ref 0.0–2.0)
BASOS ABS: 0 10*3/uL (ref 0.0–0.1)
EOS%: 2 % (ref 0.0–7.0)
Eosinophils Absolute: 0.1 10*3/uL (ref 0.0–0.5)
HCT: 42.5 % (ref 34.8–46.6)
HEMOGLOBIN: 13.9 g/dL (ref 11.6–15.9)
LYMPH%: 37 % (ref 14.0–49.7)
MCH: 28.9 pg (ref 25.1–34.0)
MCHC: 32.8 g/dL (ref 31.5–36.0)
MCV: 88.1 fL (ref 79.5–101.0)
MONO#: 0.6 10*3/uL (ref 0.1–0.9)
MONO%: 8.7 % (ref 0.0–14.0)
NEUT#: 3.3 10*3/uL (ref 1.5–6.5)
NEUT%: 51.5 % (ref 38.4–76.8)
Platelets: 265 10*3/uL (ref 145–400)
RBC: 4.82 10*6/uL (ref 3.70–5.45)
RDW: 14 % (ref 11.2–14.5)
WBC: 6.3 10*3/uL (ref 3.9–10.3)
lymph#: 2.3 10*3/uL (ref 0.9–3.3)

## 2015-10-15 NOTE — Progress Notes (Signed)
[facility] NOTE  Patient Care Team: Dion Body, MD as PCP - General (Family Medicine) Rolm Bookbinder, MD as Consulting Physician (General Surgery) Nicholas Lose, MD as Consulting Physician (Hematology and Oncology) Kyung Rudd, MD as Consulting Physician (Radiation Oncology)  CHIEF COMPLAINTS/PURPOSE OF CONSULTATION:  Newly diagnosed breast cancer  HISTORY OF PRESENTING ILLNESS:  Melody Harding 65 y.o. female is here because of recent diagnosis of right breast DCIS. Patient had a prior history of left excisional biopsy that came back as ADH in 2015. She had a routine screening mammogram that revealed new calcifications measuring 9 mm span. 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 She was presented this morning at the multidisciplinary tumor board and she is here today at the [facility] clinic to discuss her treatment plan.  I reviewed her records extensively and collaborated the history with the patient.  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  She was never exposed to fertility medications or hormone replacement therapy.  She has  family history of Breast/GYN/GI cancer Mother age 41 had pancreatic cancer with liver involvement, great aunt on the mother's side age 90 with breast cancer, onto the father's side age 43 with breast cancer  MEDICAL HISTORY:  Past Medical History  Diagnosis Date  . Seasonal allergies   . Depression    . GERD (gastroesophageal reflux disease)   . Wears glasses   . Wears hearing aid     right  . Hyperlipemia   . 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 Breast cancer [facility])     SURGICAL HISTORY: Past Surgical History  Procedure Laterality Date  . Tonsillectomy    . Tubal ligation    . Abdominal hysterectomy      part  . Wisdom tooth extraction    . Colonoscopy    . Breast ductal system excision Left 06/20/2014    Procedure: LEFT BREAST CENTRAL DUCT EXCISION;  Surgeon: Erroll Luna, MD;  Location: [facility];  Service: General;  Laterality: Left;    SOCIAL HISTORY: Social History   Social History  . Marital Status: Married    Spouse Name: N/A  . Number of Children: N/A  . Years of Education: N/A   Occupational History  . Not on file.   Social History Main Topics  . Smoking status: Never Smoker   . Smokeless tobacco: Not on file  . Alcohol Use: Yes     Comment: occ  . Drug Use: No  . Sexual Activity: Not on file   Other Topics Concern  . Not on file   Social History Narrative    FAMILY HISTORY: Family History  Problem Relation Age of Onset  . Pancreatic cancer Mother   . Liver cancer Mother   . Breast cancer Maternal Aunt   . Breast cancer Paternal Aunt     ALLERGIES:  has No Known Allergies.  MEDICATIONS:  Current Outpatient Prescriptions  Medication  Sig Dispense Refill  . cetirizine (ZYRTEC) 10 MG tablet Take 10 mg by mouth at bedtime.    Marland Kitchen escitalopram (LEXAPRO) 20 MG tablet Take 20 mg by mouth daily.    Marland Kitchen esomeprazole (NEXIUM) 40 MG capsule Take 40 mg by mouth daily at 12 noon.    Marland Kitchen estradiol (ESTRACE) 1 MG tablet Take 1 mg by mouth daily.    . montelukast (SINGULAIR) 10 MG tablet Take 10 mg by mouth at bedtime.    . rosuvastatin (CRESTOR) 20 MG tablet Take 20 mg by mouth daily.    . cetirizine-pseudoephedrine (ZYRTEC-D) 5-120 MG per tablet Take 1 tablet by mouth every morning. Reported on 10/15/2015    .  fluticasone (FLONASE) 50 MCG/ACT nasal spray Place into both nostrils daily. Reported on 10/15/2015     No current facility-administered medications for this visit.    REVIEW OF SYSTEMS:   Constitutional: Denies fevers, chills or abnormal night sweats Eyes: Denies blurriness of vision, double vision or watery eyes Ears, nose, mouth, throat, and face: Denies mucositis or sore throat Respiratory: Denies cough, dyspnea or wheezes Cardiovascular: Denies palpitation, chest discomfort or lower extremity swelling Gastrointestinal:  Denies nausea, heartburn or change in bowel habits Skin: Denies abnormal skin rashes Lymphatics: Denies new lymphadenopathy or easy bruising Neurological:Denies numbness, tingling or new weaknesses Behavioral/Psych: Mood is stable, no new changes  Breast:  Denies any palpable lumps or discharge All other systems were reviewed with the patient and are negative.  PHYSICAL EXAMINATION: ECOG PERFORMANCE STATUS: 0 - Asymptomatic  Filed Vitals:   10/15/15 1310  BP: 122/78  Pulse: 70  Temp: 98 F (36.7 C)  Resp: 18   Filed Weights   10/15/15 1310  Weight: 183 lb 4.8 oz (83.144 kg)    GENERAL:alert, no distress and comfortable SKIN: skin color, texture, turgor are normal, no rashes or significant lesions EYES: normal, conjunctiva are pink and non-injected, sclera clear OROPHARYNX:no exudate, no erythema and lips, buccal mucosa, and tongue normal  NECK: supple, thyroid normal size, non-tender, without nodularity LYMPH:  no palpable lymphadenopathy in the cervical, axillary or inguinal LUNGS: clear to auscultation and percussion with normal breathing effort HEART: regular rate & rhythm and no murmurs and no lower extremity edema ABDOMEN:abdomen soft, non-tender and normal bowel sounds Musculoskeletal:no cyanosis of digits and no clubbing  PSYCH: alert & oriented x 3 with fluent speech NEURO: no focal motor/sensory deficits BREAST: No palpable nodules in breast.  No palpable axillary or supraclavicular lymphadenopathy (exam performed in the presence of a chaperone)   LABORATORY DATA:  I have reviewed the data as listed Lab Results  Component Value Date   WBC 6.3 10/15/2015   HGB 13.9 10/15/2015   HCT 42.5 10/15/2015   MCV 88.1 10/15/2015   PLT 265 10/15/2015   Lab Results  Component Value Date   NA 138 10/15/2015   K 4.0 10/15/2015   CO2 27 10/15/2015    RADIOGRAPHIC STUDIES: I have personally reviewed the radiological reports and agreed with the findings in the report.  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 It is considered a precancerous lesion. DCIS is classified as a 0. It is generally detected through mammograms as calcifications. We discussed the significance of grades and its impact on prognosis. We  also discussed the importance of ER and PR receptors and their implications to adjuvant treatment options. Prognosis of DCIS dependence on grade, comedo necrosis. It is anticipated that if not treated, 20-30% of DCIS can develop into invasive breast cancer.  Recommendation: 1. Breast conserving surgery 2. Followed by adjuvant radiation therapy 3. Followed by antiestrogen therapy with tamoxifen 5 years  Tamoxifen counseling: We discussed the risks and benefits of tamoxifen. These include but not limited to insomnia, hot flashes, mood changes, vaginal dryness, and weight gain. Although rare, serious side effects including endometrial cancer, risk of blood clots were also discussed. We strongly believe that the benefits far outweigh the risks. Patient understands these risks and consented to starting treatment. Planned treatment duration is 5  years.  Return to clinic after surgery to discuss the final pathology report and come up with an adjuvant treatment plan.   All questions were answered. The patient knows to call the clinic with any problems, questions or concerns.    Rulon Eisenmenger, MD 10/15/2015

## 2015-10-15 NOTE — Assessment & Plan Note (Signed)
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 It is considered a precancerous lesion. DCIS is classified as a 0. It is generally detected through mammograms as calcifications. We discussed the significance of grades and its impact on prognosis. We also discussed the importance of ER and PR receptors and their implications to adjuvant treatment options. Prognosis of DCIS dependence on grade, comedo necrosis. It is anticipated that if not treated, 20-30% of DCIS can develop into invasive breast cancer.  Recommendation: 1. Breast conserving surgery 2. Followed by adjuvant radiation therapy 3. Followed by antiestrogen therapy with tamoxifen 5 years  Tamoxifen counseling: We discussed the risks and benefits of tamoxifen. These include but not limited to insomnia, hot flashes, mood changes, vaginal dryness, and weight gain. Although rare, serious side effects including endometrial cancer, risk of blood clots were also discussed. We strongly believe that the benefits far outweigh the risks. Patient understands these risks and consented to starting treatment. Planned treatment duration is 5 years.  Return to clinic after surgery to discuss the final pathology report and come up with an adjuvant treatment plan.

## 2015-10-15 NOTE — Progress Notes (Signed)
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   Intermediate grade DCIS, ER positive, PR positive.   HISTORY OF PRESENT ILLNESS::Melody Harding is a 65 y.o. female who is seen for an initial consultation visit. She had an annual mammogram revealing a new group of right breast calcifications. She has a history of left excisional biopsy in 2015 for nipple discharge that revealed atypical ductal hyperplasia. She did undergo diagnostic imaging revealing a 9 mm area of calcifications. 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 She comes today to the multidisciplinary clinic for further disposition of her care and  to meet with Dr. Lisbeth Renshaw for discussion of the role of radiotherapy.  PREVIOUS RADIATION THERAPY: No   PAST MEDICAL HISTORY:  Past Medical History  Diagnosis Date  . Seasonal allergies   . Depression   . GERD (gastroesophageal reflux disease)   . Wears glasses   . Wears hearing aid     right  . Hyperlipemia   . 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 Breast cancer ([place])      PAST SURGICAL HISTORY: Past Surgical History  Procedure Laterality Date  . Tonsillectomy    . Tubal ligation    . Abdominal hysterectomy      part  . Wisdom tooth extraction    . Colonoscopy    . Breast ductal system excision Left 06/20/2014    Procedure: LEFT BREAST CENTRAL DUCT EXCISION;  Surgeon: Erroll Luna, MD;  Location: [facility];  Service: General;  Laterality: Left;     FAMILY HISTORY: Family History  Problem Relation Age of Onset  . Pancreatic cancer Mother   .  Liver cancer Mother   . Breast cancer Maternal Aunt   . Breast cancer Paternal Aunt      SOCIAL HISTORY:  reports that she has never smoked. She does not have any smokeless tobacco history on file. She reports that she drinks alcohol. She reports that she does not use illicit drugs. 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 She is married and accompanied by her husband.   ALLERGIES: Review of patient's allergies indicates no known allergies.   MEDICATIONS:  Current Outpatient Prescriptions  Medication Sig Dispense Refill  . cetirizine (ZYRTEC) 10 MG tablet Take 10 mg by mouth at bedtime.    . cetirizine-pseudoephedrine (ZYRTEC-D) 5-120 MG per tablet Take 1 tablet by mouth every morning. Reported on 10/15/2015    . escitalopram (LEXAPRO) 20 MG tablet Take 20 mg by mouth daily.    Marland Kitchen esomeprazole (NEXIUM) 40 MG capsule Take 40 mg by mouth daily at 12 noon.    Marland Kitchen estradiol (ESTRACE) 1 MG tablet Take 1 mg by mouth daily.    . fluticasone (FLONASE) 50 MCG/ACT nasal spray Place into both nostrils daily. Reported on 10/15/2015    . montelukast (SINGULAIR) 10 MG tablet Take 10 mg by mouth at bedtime.    . rosuvastatin (CRESTOR) 20 MG tablet Take 20 mg by mouth daily.     No current facility-administered medications for this encounter.     REVIEW  OF SYSTEMS:  On review of systems the patient reports that overall she is doing extremely well. She denies any significant bruising or pain her biopsy. She is not experiencing any chest pain, shortness of breath, fevers or chills. She denies any unintended weight changes. She is not experiencing any bowel or bladder dysfunction, and denies abdominal pain, nausea, vomiting. A complete review of systems is obtained and is otherwise negative.    PHYSICAL EXAM:   Pain scale 0/10 In general this is a well appearing female in no acute distress. She is alert and oriented x4 and appropriate throughout the  examination. Skin is intact, her anterior trunk has multiple moles and lesions consistent with seborrheic keratosis. Cardiovascular exam reveals a regular rate and rhythm, no clicks rubs or murmurs are auscultated. Chest is clear to auscultation bilaterally. Lymphatic assessment is performed and does not reveal any adenopathy in the cervical, supraclavicular, axillary, or inguinal chains. Bilateral breasts are examined, the left does not have any palpable abnormalities and has a small incision site along the inferior aspect of the areola consistent with her previous incision. No nipple discharge or bleeding is present. The rt breast is also examined and minimal ecchymosis are present at the 7 o'clock position from her prev biopsy. No palp abnormalities are present. No nipple bleeding or discharges noted. Abdomen has active bowel sounds in all quadrants and is intact. The abdomen is soft, non tender, non distended. Lower extremities are negative for pretibial pitting edema, deep calf tenderness, cyanosis or clubbing.  ECOG = 0  0 - Asymptomatic (Fully active, able to carry on all predisease activities without restriction)  1 - Symptomatic but completely ambulatory (Restricted in physically strenuous activity but ambulatory and able to carry out work of a light or sedentary nature. For example, light housework, office work)  2 - Symptomatic, <50% in bed during the day (Ambulatory and capable of all self care but unable to carry out any work activities. Up and about more than 50% of waking hours)  3 - Symptomatic, >50% in bed, but not bedbound (Capable of only limited self-care, confined to bed or chair 50% or more of waking hours)  4 - Bedbound (Completely disabled. Cannot carry on any self-care. Totally confined to bed or chair)  5 - Death   Eustace Pen MM, Creech RH, Tormey DC, et al. [phone]). "Toxicity and response criteria of the [facility]". [place] Oncol. 5 (6):  [phone]    Gynecologic History  Age at first menstrual period? 14  Are you still having periods? No Approximate date of last period? May 1995  If you are still having periods: Are your periods regular? No  If you no longer have periods: Have you used hormone replacement? Yes  If YES, for how long? 21 years When did you stop? Still take Obstetric History:  How many children have you carried to term? 1 Your age at first live birth? 32  Pregnant now or trying to get pregnant? No  Have you used birth control pills or hormone shots for contraception? Yes. Birth control pills.  If so, for how long (or approximate dates)? [id number], [phone]  Would you be interested in learning more about the options to preserve fertility? No Health Maintenance:  Have you ever had a colonoscopy? Yes If yes, date? 2012  Have you ever had a bone density? Yes If yes, date? 1993  Date of your last PAP smear? 2009 Date of your FIRST mammogram? Age 89, 1987  LABORATORY DATA:  Lab Results  Component Value Date   WBC 6.3 10/15/2015   HGB 13.9 10/15/2015   HCT 42.5 10/15/2015   MCV 88.1 10/15/2015   PLT 265 10/15/2015   Lab Results  Component Value Date   NA 138 10/15/2015   K 4.0 10/15/2015   CO2 27 10/15/2015   Lab Results  Component Value Date   ALT 12 10/15/2015   AST 13 10/15/2015   ALKPHOS 86 10/15/2015   BILITOT <0.30 10/15/2015      RADIOGRAPHY: No results found.  IMPRESSION: Ms. Oberoi is a 65 yo female with intermediate grade DCIS, ER/PR positive.   PLAN: Dr. Lisbeth Renshaw discusses the findings on the patient's pathology, and imaging studies, and reviews the recommendations from breast conference this morning which included lumpectomy followed by external radiotherapy to the right breast followed by antiestrogen support. He reviews with the patient the rationale for moving forward with radiotherapy, and outlines the role of this in relationship to request reduction. He outlines the  recommendations for approximately 20 fractions to the right breast, and discusses that we would plan to see her back in about 2-3 weeks following surgery with the intention of beginning her radiotherapy approximately one month following her surgery. The patient states agreement and understanding and is interested in moving forward. The risks, benefits, long and short term side effects of radiotherapy were discussed with the patient and her family, and at the end of the visit, all of her questions were answered to her satisfaction.  The above documentation reflects my direct findings during this shared patient visit. Please see the separate note by Dr. Lisbeth Renshaw on this date for the remainder of the patient's plan of care.  Carola Rhine, PAC   This document serves as a record of services personally performed by Shona Simpson, PAC and Kyung Rudd, MD. It was created on their behalf by Lendon Collar, a trained medical scribe. The creation of this record is based on the scribe's personal observations and the provider's statements to them. This document has been checked and approved by the attending provider.

## 2015-10-15 NOTE — Progress Notes (Signed)
Ms. Melody Harding is a very pleasant 65 y.o. female from [place], [place] with newly diagnosed ductal carcinoma in situ of the right breast.  Biopsy results revealed the tumor's prognostic profile is ER positive and PR positive.   She presents today to the [facility]) for treatment consideration and recommendations from the breast surgeon, radiation oncologist, and medical oncologist.     I briefly met with Ms. Melody Harding during her [facility] visit today. We discussed the purpose of the Survivorship Clinic, which will include monitoring for recurrence, coordinating completion of age and gender-appropriate cancer screenings, promotion of overall wellness, as well as managing potential late/long-term side effects of anti-cancer treatments.    The treatment plan for Ms. Melody Harding will likely include surgery, radiation therapy, and anti-estrogen therapy.  As of today, the intent of treatment for Ms. Melody Harding is cure, therefore she will be eligible for the Survivorship Clinic upon her completion of treatment.  Her survivorship care plan (SCP) document will be drafted and updated throughout the course of her treatment trajectory. She will receive the SCP in an office visit with myself in the Survivorship Clinic once she has completed treatment.   Ms. Melody Harding was encouraged to ask questions and all questions were answered to her satisfaction.  She was given my business card and encouraged to contact me with any concerns regarding survivorship.  I look forward to participating in her care.   Kenn File, [place] [phone]

## 2015-10-17 ENCOUNTER — Encounter (HOSPITAL_BASED_OUTPATIENT_CLINIC_OR_DEPARTMENT_OTHER): Payer: Self-pay | Admitting: *Deleted

## 2015-10-21 ENCOUNTER — Telehealth: Payer: Self-pay | Admitting: *Deleted

## 2015-10-21 NOTE — Telephone Encounter (Signed)
Spoke to pt concerning [place] from 10/15/15. Denies questions or concerns regarding dx or treatment care plan. Scheduled and confirmed f/u with Dr. Lindi Adie on 3/29 for post op. Referral sent for post op with Dr. Lisbeth Renshaw. Encourage pt to call with needs. Received verbal understanding.

## 2015-10-22 ENCOUNTER — Ambulatory Visit (HOSPITAL_BASED_OUTPATIENT_CLINIC_OR_DEPARTMENT_OTHER): Payer: BC Managed Care – PPO | Admitting: Certified Registered"

## 2015-10-22 ENCOUNTER — Encounter (HOSPITAL_BASED_OUTPATIENT_CLINIC_OR_DEPARTMENT_OTHER)
Admission: RE | Disposition: A | Discharge status: Home / Self Care | Payer: Self-pay | Source: Ambulatory Visit | Attending: General Surgery

## 2015-10-22 ENCOUNTER — Ambulatory Visit (HOSPITAL_BASED_OUTPATIENT_CLINIC_OR_DEPARTMENT_OTHER)
Admission: RE | Admit: 2015-10-22 | Discharge: 2015-10-22 | Disposition: A | Discharge status: Home / Self Care | Payer: BC Managed Care – PPO | Source: Ambulatory Visit | Attending: General Surgery | Admitting: General Surgery

## 2015-10-22 ENCOUNTER — Encounter (HOSPITAL_BASED_OUTPATIENT_CLINIC_OR_DEPARTMENT_OTHER): Payer: Self-pay | Admitting: *Deleted

## 2015-10-22 DIAGNOSIS — F329 Major depressive disorder, single episode, unspecified: Secondary | ICD-10-CM | POA: Diagnosis not present

## 2015-10-22 DIAGNOSIS — D241 Benign neoplasm of right breast: Secondary | ICD-10-CM | POA: Insufficient documentation

## 2015-10-22 DIAGNOSIS — Z170 Estrogen receptor positive status [ER+]: Secondary | ICD-10-CM | POA: Diagnosis not present

## 2015-10-22 DIAGNOSIS — Z803 Family history of malignant neoplasm of breast: Secondary | ICD-10-CM | POA: Insufficient documentation

## 2015-10-22 DIAGNOSIS — D0511 Intraductal carcinoma in situ of right breast: Secondary | ICD-10-CM | POA: Insufficient documentation

## 2015-10-22 DIAGNOSIS — C50911 Malignant neoplasm of unspecified site of right female breast: Secondary | ICD-10-CM | POA: Diagnosis present

## 2015-10-22 DIAGNOSIS — Z6835 Body mass index (BMI) 35.0-35.9, adult: Secondary | ICD-10-CM | POA: Diagnosis not present

## 2015-10-22 DIAGNOSIS — E669 Obesity, unspecified: Secondary | ICD-10-CM | POA: Diagnosis not present

## 2015-10-22 DIAGNOSIS — K219 Gastro-esophageal reflux disease without esophagitis: Secondary | ICD-10-CM | POA: Diagnosis not present

## 2015-10-22 DIAGNOSIS — Z79899 Other long term (current) drug therapy: Secondary | ICD-10-CM | POA: Insufficient documentation

## 2015-10-22 DIAGNOSIS — Z79890 Hormone replacement therapy (postmenopausal): Secondary | ICD-10-CM | POA: Insufficient documentation

## 2015-10-22 DIAGNOSIS — E785 Hyperlipidemia, unspecified: Secondary | ICD-10-CM | POA: Diagnosis not present

## 2015-10-22 HISTORY — PX: BREAST LUMPECTOMY WITH RADIOACTIVE SEED LOCALIZATION: SHX6424

## 2015-10-22 SURGERY — BREAST LUMPECTOMY WITH RADIOACTIVE SEED LOCALIZATION
Anesthesia: General | Site: Breast | Laterality: Right

## 2015-10-22 MED ORDER — PROPOFOL 10 MG/ML IV BOLUS
INTRAVENOUS | Status: DC | PRN
  Administered 2015-10-22: 150 mg via INTRAVENOUS

## 2015-10-22 MED ORDER — FENTANYL CITRATE (PF) 100 MCG/2ML IJ SOLN
50.0000 ug | INTRAMUSCULAR | Status: DC | PRN
  Administered 2015-10-22: 50 ug via INTRAVENOUS

## 2015-10-22 MED ORDER — EPHEDRINE SULFATE 50 MG/ML IJ SOLN
INTRAMUSCULAR | Status: AC
  Filled 2015-10-22: qty 1

## 2015-10-22 MED ORDER — CEFAZOLIN SODIUM-DEXTROSE 2-3 GM-% IV SOLR
2.0000 g | INTRAVENOUS | Status: AC
  Administered 2015-10-22: 2 g via INTRAVENOUS

## 2015-10-22 MED ORDER — SODIUM CHLORIDE 0.9 % IJ SOLN
INTRAMUSCULAR | Status: AC
  Filled 2015-10-22: qty 10

## 2015-10-22 MED ORDER — LIDOCAINE HCL (CARDIAC) 20 MG/ML IV SOLN
INTRAVENOUS | Status: AC
  Filled 2015-10-22: qty 5

## 2015-10-22 MED ORDER — ONDANSETRON HCL 4 MG/2ML IJ SOLN
INTRAMUSCULAR | Status: DC | PRN
  Administered 2015-10-22: 4 mg via INTRAVENOUS

## 2015-10-22 MED ORDER — SCOPOLAMINE 1 MG/3DAYS TD PT72: 1.0000 | MEDICATED_PATCH | Freq: Once | TRANSDERMAL | Status: DC | PRN

## 2015-10-22 MED ORDER — BUPIVACAINE HCL (PF) 0.25 % IJ SOLN
INTRAMUSCULAR | Status: DC | PRN
  Administered 2015-10-22: 10 mL

## 2015-10-22 MED ORDER — PROPOFOL 10 MG/ML IV BOLUS
INTRAVENOUS | Status: AC
  Filled 2015-10-22: qty 20

## 2015-10-22 MED ORDER — LACTATED RINGERS IV SOLN
INTRAVENOUS | Status: DC
  Administered 2015-10-22: 08:00:00 via INTRAVENOUS

## 2015-10-22 MED ORDER — PROMETHAZINE HCL 25 MG/ML IJ SOLN: 6.2500 mg | INTRAMUSCULAR | Status: DC | PRN

## 2015-10-22 MED ORDER — ONDANSETRON HCL 4 MG/2ML IJ SOLN
INTRAMUSCULAR | Status: AC
  Filled 2015-10-22: qty 2

## 2015-10-22 MED ORDER — MIDAZOLAM HCL 2 MG/2ML IJ SOLN
INTRAMUSCULAR | Status: AC
  Filled 2015-10-22: qty 2

## 2015-10-22 MED ORDER — METHYLENE BLUE 0.5 % INJ SOLN
INTRAVENOUS | Status: AC
  Filled 2015-10-22: qty 10

## 2015-10-22 MED ORDER — GLYCOPYRROLATE 0.2 MG/ML IJ SOLN: 0.2000 mg | Freq: Once | INTRAMUSCULAR | Status: DC | PRN

## 2015-10-22 MED ORDER — CEFAZOLIN SODIUM-DEXTROSE 2-3 GM-% IV SOLR
INTRAVENOUS | Status: AC
  Filled 2015-10-22: qty 50

## 2015-10-22 MED ORDER — MIDAZOLAM HCL 2 MG/2ML IJ SOLN
1.0000 mg | INTRAMUSCULAR | Status: DC | PRN
  Administered 2015-10-22: 1 mg via INTRAVENOUS

## 2015-10-22 MED ORDER — LIDOCAINE HCL (CARDIAC) 20 MG/ML IV SOLN
INTRAVENOUS | Status: DC | PRN
  Administered 2015-10-22: 60 mg via INTRAVENOUS

## 2015-10-22 MED ORDER — FENTANYL CITRATE (PF) 100 MCG/2ML IJ SOLN
INTRAMUSCULAR | Status: AC
  Filled 2015-10-22: qty 2

## 2015-10-22 MED ORDER — DEXAMETHASONE SODIUM PHOSPHATE 10 MG/ML IJ SOLN
INTRAMUSCULAR | Status: AC
  Filled 2015-10-22: qty 1

## 2015-10-22 MED ORDER — HYDROMORPHONE HCL 1 MG/ML IJ SOLN: 0.2500 mg | INTRAMUSCULAR | Status: DC | PRN

## 2015-10-22 MED ORDER — DEXAMETHASONE SODIUM PHOSPHATE 4 MG/ML IJ SOLN
INTRAMUSCULAR | Status: DC | PRN
  Administered 2015-10-22: 4 mg via INTRAVENOUS

## 2015-10-22 MED ORDER — HYDROCODONE-ACETAMINOPHEN 10-325 MG PO TABS: 1.0000 | ORAL_TABLET | Freq: Four times a day (QID) | ORAL | Status: DC | PRN

## 2015-10-22 MED ORDER — BUPIVACAINE HCL (PF) 0.25 % IJ SOLN
INTRAMUSCULAR | Status: AC
  Filled 2015-10-22: qty 180

## 2015-10-22 SURGICAL SUPPLY — 57 items
APPLIER CLIP 9.375 MED OPEN (MISCELLANEOUS) ×2
APR CLP MED 9.3 20 MLT OPN (MISCELLANEOUS) ×1
BINDER BREAST LRG (GAUZE/BANDAGES/DRESSINGS) IMPLANT
BINDER BREAST MEDIUM (GAUZE/BANDAGES/DRESSINGS) IMPLANT
BINDER BREAST XLRG (GAUZE/BANDAGES/DRESSINGS) ×1 IMPLANT
BINDER BREAST XXLRG (GAUZE/BANDAGES/DRESSINGS) IMPLANT
BLADE SURG 15 STRL LF DISP TIS (BLADE) ×1 IMPLANT
BLADE SURG 15 STRL SS (BLADE) ×2
CANISTER SUC SOCK COL 7IN (MISCELLANEOUS) IMPLANT
CANISTER SUCT 1200ML W/VALVE (MISCELLANEOUS) IMPLANT
CHLORAPREP W/TINT 26ML (MISCELLANEOUS) ×2 IMPLANT
CLIP APPLIE 9.375 MED OPEN (MISCELLANEOUS) IMPLANT
COVER BACK TABLE 60X90IN (DRAPES) ×2 IMPLANT
COVER MAYO STAND STRL (DRAPES) ×2 IMPLANT
COVER PROBE W GEL 5X96 (DRAPES) ×2 IMPLANT
DECANTER SPIKE VIAL GLASS SM (MISCELLANEOUS) IMPLANT
DEVICE DUBIN W/COMP PLATE 8390 (MISCELLANEOUS) ×2 IMPLANT
DRAPE LAPAROSCOPIC ABDOMINAL (DRAPES) ×2 IMPLANT
DRAPE UTILITY XL STRL (DRAPES) ×2 IMPLANT
DRSG TEGADERM 4X4.75 (GAUZE/BANDAGES/DRESSINGS) IMPLANT
ELECT COATED BLADE 2.86 ST (ELECTRODE) ×2 IMPLANT
ELECT REM PT RETURN 9FT ADLT (ELECTROSURGICAL) ×2
ELECTRODE REM PT RTRN 9FT ADLT (ELECTROSURGICAL) ×1 IMPLANT
GLOVE BIO SURGEON STRL SZ7 (GLOVE) ×4 IMPLANT
GLOVE BIOGEL PI IND STRL 6.5 (GLOVE) IMPLANT
GLOVE BIOGEL PI IND STRL 7.5 (GLOVE) ×1 IMPLANT
GLOVE BIOGEL PI INDICATOR 6.5 (GLOVE) ×1
GLOVE BIOGEL PI INDICATOR 7.5 (GLOVE) ×1
GLOVE ECLIPSE 6.5 STRL STRAW (GLOVE) ×1 IMPLANT
GOWN STRL REUS W/ TWL LRG LVL3 (GOWN DISPOSABLE) ×2 IMPLANT
GOWN STRL REUS W/TWL LRG LVL3 (GOWN DISPOSABLE) ×4
ILLUMINATOR WAVEGUIDE N/F (MISCELLANEOUS) IMPLANT
KIT MARKER MARGIN INK (KITS) ×2 IMPLANT
LIGHT WAVEGUIDE WIDE FLAT (MISCELLANEOUS) IMPLANT
LIQUID BAND (GAUZE/BANDAGES/DRESSINGS) ×2 IMPLANT
NDL HYPO 25X1 1.5 SAFETY (NEEDLE) ×1 IMPLANT
NEEDLE HYPO 25X1 1.5 SAFETY (NEEDLE) ×2 IMPLANT
NS IRRIG 1000ML POUR BTL (IV SOLUTION) IMPLANT
PACK BASIN DAY SURGERY FS (CUSTOM PROCEDURE TRAY) ×2 IMPLANT
PENCIL BUTTON HOLSTER BLD 10FT (ELECTRODE) ×2 IMPLANT
SLEEVE SCD COMPRESS KNEE MED (MISCELLANEOUS) ×2 IMPLANT
SPONGE GAUZE 4X4 12PLY STER LF (GAUZE/BANDAGES/DRESSINGS) IMPLANT
SPONGE LAP 4X18 X RAY DECT (DISPOSABLE) ×2 IMPLANT
STRIP CLOSURE SKIN 1/2X4 (GAUZE/BANDAGES/DRESSINGS) ×2 IMPLANT
SUT MNCRL AB 4-0 PS2 18 (SUTURE) ×1 IMPLANT
SUT MON AB 5-0 PS2 18 (SUTURE) IMPLANT
SUT SILK 2 0 SH (SUTURE) IMPLANT
SUT VIC AB 2-0 SH 27 (SUTURE) ×2
SUT VIC AB 2-0 SH 27XBRD (SUTURE) ×1 IMPLANT
SUT VIC AB 3-0 SH 27 (SUTURE) ×2
SUT VIC AB 3-0 SH 27X BRD (SUTURE) ×1 IMPLANT
SUT VIC AB 5-0 PS2 18 (SUTURE) ×1 IMPLANT
SYR CONTROL 10ML LL (SYRINGE) ×2 IMPLANT
TOWEL OR 17X24 6PK STRL BLUE (TOWEL DISPOSABLE) ×1 IMPLANT
TOWEL OR NON WOVEN STRL DISP B (DISPOSABLE) ×2 IMPLANT
TUBE CONNECTING 20X1/4 (TUBING) IMPLANT
YANKAUER SUCT BULB TIP NO VENT (SUCTIONS) IMPLANT

## 2015-10-22 NOTE — Interval H&P Note (Signed)
History and Physical Interval Note:  10/22/2015 8:36 AM  Melody Harding  has presented today for surgery, with the diagnosis of RIGHT BREAST CANCER  The various methods of treatment have been discussed with the patient and family. After consideration of risks, benefits and other options for treatment, the patient has consented to  Procedure(s): BREAST LUMPECTOMY WITH RADIOACTIVE SEED LOCALIZATION (Right) as a surgical intervention .  The patient's history has been reviewed, patient examined, no change in status, stable for surgery.  I have reviewed the patient's chart and labs.  Questions were answered to the patient's satisfaction.     Mitesh Rosendahl

## 2015-10-22 NOTE — Interval H&P Note (Signed)
History and Physical Interval Note:  10/22/2015 8:32 AM  Melody Harding  has presented today for surgery, with the diagnosis of RIGHT BREAST CANCER  The various methods of treatment have been discussed with the patient and family. After consideration of risks, benefits and other options for treatment, the patient has consented to  Procedure(s): BREAST LUMPECTOMY WITH RADIOACTIVE SEED LOCALIZATION (Right) as a surgical intervention .  The patient's history has been reviewed, patient examined, no change in status, stable for surgery.  I have reviewed the patient's chart and labs.  Questions were answered to the patient's satisfaction.     Dade Rodin

## 2015-10-22 NOTE — Transfer of Care (Signed)
Immediate Anesthesia Transfer of Care Note  Patient: Melody Harding  Procedure(s) Performed: Procedure(s): BREAST LUMPECTOMY WITH RADIOACTIVE SEED LOCALIZATION (Right)  Patient Location: PACU  Anesthesia Type:General  Level of Consciousness: awake, alert , oriented and patient cooperative  Airway & Oxygen Therapy: Patient Spontanous Breathing and Patient connected to face mask oxygen  Post-op Assessment: Report given to RN and Post -op Vital signs reviewed and stable  Post vital signs: Reviewed and stable  Last Vitals:  Filed Vitals:   10/22/15 0725  BP: 156/71  Pulse: 67  Temp: 36.4 C  Resp: 20    Complications: No apparent anesthesia complications

## 2015-10-22 NOTE — Anesthesia Postprocedure Evaluation (Signed)
Anesthesia Post Note  Patient: Melody Harding  Procedure(s) Performed: Procedure(s) (LRB): BREAST LUMPECTOMY WITH RADIOACTIVE SEED LOCALIZATION (Right)  Patient location during evaluation: PACU Anesthesia Type: General Level of consciousness: awake and alert Pain management: pain level controlled Vital Signs Assessment: post-procedure vital signs reviewed and stable Respiratory status: spontaneous breathing, nonlabored ventilation, respiratory function stable and patient connected to nasal cannula oxygen Cardiovascular status: blood pressure returned to baseline and stable Postop Assessment: no signs of nausea or vomiting Anesthetic complications: no    Last Vitals:  Filed Vitals:   10/22/15 1015 10/22/15 1030  BP: 146/68 144/84  Pulse: 85 80  Temp:    Resp: 16 16    Last Pain:  Filed Vitals:   10/22/15 1039  PainSc: 3                  Isabel Freese,JAMES TERRILL

## 2015-10-22 NOTE — Op Note (Signed)
Preoperative diagnosis:clinical stage 0 right breast cancer Postoperative diagnosis: same as above Procedure: Right breast seed guided lumpectomy Surgeon: Dr Serita Grammes EBL: minimal Anes: general  Specimens right breast tissue marked with paint, additional superior margin marked short superior, long lateral, double deep. Complications none Drains none Sponge count correct Dispo to pacu stable  Indications: This is a 36 yof who has right breast dcis.  She was seen in our multidisciplinary clinic.  We discussed seed guided lumpectomy along. The seed and wire was placed  preoperatively and I had these images in the OR.  Procedure: After informed consent was obtained the patient was taken to the operating room. She was given antibiotics. Sequential compression devices were on her legs. She was then placed under general anesthesia with an LMA. Then she was then prepped and draped in the standard sterile surgical fashion. Surgical timeout was then performed. I then made a periareolar incision in the right breast.  I then removed the seed with an attempt to get a clear margin.  I did remove some additional superior tissue also.  I placed clips in the cavity. I closed the deep tissue with 2-0 vicryl. The superficial tissue was closedwith 3-0 vicryl and the skin was then closed with 5-0 monocryl Glue and steristrips were applied. A breast binder was placed. She was extubated and transferred to recovery stable

## 2015-10-22 NOTE — Discharge Instructions (Signed)
[facility] °Office Phone Number [phone] ° °POST OP INSTRUCTIONS ° °Always review your discharge instruction sheet given to you by the facility where your surgery was performed. ° °IF YOU HAVE DISABILITY OR FAMILY LEAVE FORMS, YOU MUST BRING THEM TO THE OFFICE FOR PROCESSING.  DO NOT GIVE THEM TO YOUR DOCTOR. ° °1. A prescription for pain medication may be given to you upon discharge.  Take your pain medication as prescribed, if needed.  If narcotic pain medicine is not needed, then you may take acetaminophen (Tylenol), naprosyn (Alleve) or ibuprofen (Advil) as needed. °2. Take your usually prescribed medications unless otherwise directed °3. If you need a refill on your pain medication, please contact your pharmacy.  They will contact our office to request authorization.  Prescriptions will not be filled after 5pm or on week-ends. °4. You should eat very light the first 24 hours after surgery, such as soup, crackers, pudding, etc.  Resume your normal diet the day after surgery. °5. Most patients will experience some swelling and bruising in the breast.  Ice packs and a good support bra will help.  Wear the breast binder provided or a sports bra for 72 hours day and night.  After that wear a sports bra during the day until you return to the office. Swelling and bruising can take several days to resolve.  °6. It is common to experience some constipation if taking pain medication after surgery.  Increasing fluid intake and taking a stool softener will usually help or prevent this problem from occurring.  A mild laxative (Milk of Magnesia or Miralax) should be taken according to package directions if there are no bowel movements after 48 hours. °7. Unless discharge instructions indicate otherwise, you may remove your bandages 48 hours after surgery and you may shower at that time.  You may have steri-strips (small skin tapes) in place directly over the incision.  These strips should be left on the  skin for 7-10 days and will come off on their own.  If your surgeon used skin glue on the incision, you may shower in 24 hours.  The glue will flake off over the next 2-3 weeks.  Any sutures or staples will be removed at the office during your follow-up visit. °8. ACTIVITIES:  You may resume regular daily activities (gradually increasing) beginning the next day.  Wearing a good support bra or sports bra minimizes pain and swelling.  You may have sexual intercourse when it is comfortable. °a. You may drive when you no longer are taking prescription pain medication, you can comfortably wear a seatbelt, and you can safely maneuver your car and apply brakes. °b. RETURN TO WORK:  ______________________________________________________________________________________ °9. You should see your doctor in the office for a follow-up appointment approximately two weeks after your surgery.  Your doctor’s nurse will typically make your follow-up appointment when she calls you with your pathology report.  Expect your pathology report 3-4 business days after your surgery.  You may call to check if you do not hear from us after three days. °10. OTHER INSTRUCTIONS: _______________________________________________________________________________________________ _____________________________________________________________________________________________________________________________________ °_____________________________________________________________________________________________________________________________________ °_____________________________________________________________________________________________________________________________________ ° °WHEN TO CALL DR WAKEFIELD: °1. Fever over 101.0 °2. Nausea and/or vomiting. °3. Extreme swelling or bruising. °4. Continued bleeding from incision. °5. Increased pain, redness, or drainage from the incision. ° °The clinic staff is available to answer your questions during regular  business hours.  Please don’t hesitate to call and ask to speak to one of the nurses for clinical concerns.  If   you have a medical emergency, go to the nearest emergency room or call 911.  A surgeon from [facility] is always on call at the hospital. ° °For further questions, please visit [email or web address] ° ° ° °Post Anesthesia Home Care Instructions ° °Activity: °Get plenty of rest for the remainder of the day. A responsible adult should stay with you for 24 hours following the procedure.  °For the next 24 hours, DO NOT: °-Drive a car °-Operate machinery °-Drink alcoholic beverages °-Take any medication unless instructed by your physician °-Make any legal decisions or sign important papers. ° °Meals: °Start with liquid foods such as gelatin or soup. Progress to regular foods as tolerated. Avoid greasy, spicy, heavy foods. If nausea and/or vomiting occur, drink only clear liquids until the nausea and/or vomiting subsides. Call your physician if vomiting continues. ° °Special Instructions/Symptoms: °Your throat may feel dry or sore from the anesthesia or the breathing tube placed in your throat during surgery. If this causes discomfort, gargle with warm salt water. The discomfort should disappear within 24 hours. ° °If you had a scopolamine patch placed behind your ear for the management of post- operative nausea and/or vomiting: ° °1. The medication in the patch is effective for 72 hours, after which it should be removed.  Wrap patch in a tissue and discard in the trash. Wash hands thoroughly with soap and water. °2. You may remove the patch earlier than 72 hours if you experience unpleasant side effects which may include dry mouth, dizziness or visual disturbances. °3. Avoid touching the patch. Wash your hands with soap and water after contact with the patch. °  ° °

## 2015-10-22 NOTE — H&P (Signed)
Melody Harding is an 65 y.o. female.   Chief Complaint: dcis HPI:  81 yof who underwent screening mammogram that showed new right breast calcifications. she has history of left breast biopsy for adh. she has family history of paternal aunt over 50. there is 9 mm area of calcifications on her mm. 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 she has no complaints referable to either breast. 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 she is here with husband today    Past Medical History  Diagnosis Date  . Seasonal allergies   . Depression   . GERD (gastroesophageal reflux disease)   . Wears glasses   . Wears hearing aid     right  . Hyperlipemia   . 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 Breast cancer [facility])     Past Surgical History  Procedure Laterality Date  . Tonsillectomy    . Tubal ligation    . Abdominal hysterectomy      part  . Wisdom tooth extraction    . Colonoscopy    . Breast ductal system excision Left 06/20/2014    Procedure: LEFT BREAST CENTRAL DUCT EXCISION;  Surgeon: Erroll Luna, MD;  Location: [place];  Service: General;  Laterality: Left;    Family History  Problem Relation Age of Onset  . Pancreatic cancer Mother   . Liver cancer Mother   . Breast cancer Maternal Aunt   . Breast cancer Paternal Aunt    Social History:  reports that she has never smoked. She does not have any smokeless tobacco history on file. She reports that she drinks alcohol. She reports that she does not use illicit drugs.  Allergies: No Known Allergies  Medications Prior to Admission  Medication Sig Dispense Refill  . cetirizine (ZYRTEC) 10 MG tablet Take 10 mg by mouth at bedtime.    . cetirizine-pseudoephedrine (ZYRTEC-D) 5-120 MG per tablet Take 1 tablet by mouth every morning. Reported on 10/15/2015    . escitalopram (LEXAPRO) 20 MG tablet Take 20 mg by mouth daily.    Marland Kitchen esomeprazole (NEXIUM)  40 MG capsule Take 40 mg by mouth daily at 12 noon.    Marland Kitchen estradiol (ESTRACE) 1 MG tablet Take 1 mg by mouth daily.    . fluticasone (FLONASE) 50 MCG/ACT nasal spray Place into both nostrils daily. Reported on 10/15/2015    . montelukast (SINGULAIR) 10 MG tablet Take 10 mg by mouth at bedtime.    . rosuvastatin (CRESTOR) 20 MG tablet Take 20 mg by mouth daily.      No results found for this or any previous visit (from the past 48 hour(s)). No results found.  ROS Negative  Blood pressure 156/71, pulse 67, temperature 97.6 F (36.4 C), temperature source Oral, resp. rate 20, height 5' (1.524 m), weight 83.008 kg (183 lb), SpO2 99 %. Physical Exam   Physical Exam Rolm Bookbinder MD; 10/15/2015 10:12 PM) General Mental Status-Alert. Orientation-Oriented X3.  Chest and Lung Exam Chest and lung exam reveals -on auscultation, normal breath sounds, no adventitious sounds and normal vocal resonance.  Breast Nipples-No Discharge. Breast Lump-No Palpable Breast Mass.  Cardiovascular Cardiovascular examination reveals -normal heart sounds, regular rate and rhythm with no murmurs.  Lymphatic Head & Neck  General Head & Neck Lymphatics: Bilateral - Description - Normal. Axillary  General Axillary Region: Bilateral - Description - Normal. Note: no Johnson adenopathy  Assessment/Plan   Assessment &  Plan Rolm Bookbinder MD; 10/15/2015 10:14 PM) DUCTAL CARCINOMA IN SITU (DCIS) OF RIGHT BREAST (D05.11) Story: right breast seed guided lumpectomy we discussed pathophysiology of breast cancer and all available treatments. We discussed the options for treatment of the breast cancer which included lumpectomy versus a mastectomy. We discussed the performance of the lumpectomy with radioactive seed placement. We discussed a 5-10% chance of a positive margin requiring reexcision in the operating room. We also discussed that she will likely need radiation therapy (this is usually 5-7  weeks) if she undergoes lumpectomy. The breast cannot undergo more radiation therapy in the same breast after lumpectomy in the future. We discussed the mastectomy (removal of whole breast) and the postoperative care for that as well. Mastectomy can be followed by reconstruction. This is a more extensive surgery and requires more recovery. Most mastectomy patients will not need radiation therapy. We discussed that there is no difference in her survival whether she undergoes lumpectomy with radiation therapy or antiestrogen therapy versus a mastectomy. There is also no real difference between her recurrence in the breast. There is always a chance that there could be invasive tumor in the final specimen necessitating further surgery We discussed the risks of operation including bleeding, infection, possible reoperation. She understands her further therapy will be based on what her stages at the time of her operation.  Rolm Bookbinder, MD 10/22/2015, 8:31 AM

## 2015-10-22 NOTE — Anesthesia Preprocedure Evaluation (Addendum)
Anesthesia Evaluation  Patient identified by MRN, date of birth, ID band Patient awake    Reviewed: Allergy & Precautions, NPO status , Patient's Chart, lab work & pertinent test results  Airway Mallampati: III  TM Distance: >3 FB Neck ROM: Full    Dental  (+) Teeth Intact   Pulmonary neg pulmonary ROS,    breath sounds clear to auscultation       Cardiovascular  Rhythm:Regular Rate:Normal     Neuro/Psych Depression negative neurological ROS     GI/Hepatic Neg liver ROS, GERD  ,  Endo/Other  negative endocrine ROS  Renal/GU      Musculoskeletal   Abdominal (+) + obese,   Peds  Hematology negative hematology ROS (+)   Anesthesia Other Findings   Reproductive/Obstetrics                           Anesthesia Physical Anesthesia Plan  ASA: II  Anesthesia Plan: General   Post-op Pain Management:    Induction: Intravenous  Airway Management Planned: LMA  Additional Equipment:   Intra-op Plan:   Post-operative Plan: Extubation in OR  Informed Consent: I have reviewed the patients History and Physical, chart, labs and discussed the procedure including the risks, benefits and alternatives for the proposed anesthesia with the patient or authorized representative who has indicated his/her understanding and acceptance.   Dental advisory given  Plan Discussed with: CRNA and Surgeon  Anesthesia Plan Comments:         Anesthesia Quick Evaluation

## 2015-10-23 ENCOUNTER — Encounter (HOSPITAL_BASED_OUTPATIENT_CLINIC_OR_DEPARTMENT_OTHER): Payer: Self-pay | Admitting: General Surgery

## 2015-10-31 ENCOUNTER — Telehealth: Payer: Self-pay | Admitting: Radiation Oncology

## 2015-10-31 NOTE — Telephone Encounter (Signed)
Offered to move up pt's new consult appt but she is unable to change due to schedule with seeing Dr. Lindi Adie.

## 2015-11-04 NOTE — Assessment & Plan Note (Signed)
Right breast calcifications on screening mammogram 10/06/2015:9 mm span, intermediate grade DCIS with central necrosis and calcifications ER PR positive Rt Lumpectomy 10/20/15: DCIS with calcs, Margins Neg, Er 90%, PR 90%, TisN0 (Stage 0)  Pathology counseling: I discussed the final pathology report of the patient provided  a copy of this report. I discussed the margins as well as lymph node surgeries. We also discussed the final staging along with previously performed ER/PR testing.  Treatment Plan: 1. Adj XRT 2. Foll by Tamoxifen  RTC in 3 months to start Tamoxifen.

## 2015-11-04 NOTE — Progress Notes (Addendum)
Location of Breast Cancer: Right Breast  Histology per Pathology Report:   Diagnosis : 10/06/15: Breast, right, needle core biopsy - DUCTAL CARCINOMA IN SITU WITH COMEDONECROSIS :   Receptor Status: ER(90%+, PR (90%+), Her2-neu (), Ki-()  Did patient present with symptoms (if so, please note symptoms) or was this found on screening mammography?: , Routine screening , saw Dr. Lindi Adie this am  Past/Anticipated interventions by surgeon, if EOF:HQRFXJOIT : 10/22/15: Dr. Darleen Crocker  1. Breast, lumpectomy, Right - DUCTAL CARCINOMA IN SITU WITH CALCIFICATIONS, INTERMEDIATE GRADE, SPANNING 0.9 CM. - THE SURGICAL RESECTION MARGINS ARE NEGATIVE FOR CARCINOMA. - SEE ONCOLOGY TABLE BELOW. 2. Breast, excision, Right superior margin - INTRADUCTAL PAPILLOMA WITH USUAL DUCTAL HYPERPLASIA. - FIBROCYSTIC CHANGES. - THERE IS NO EVIDENCE OF MALIGNANCY. - RADIAL SCAR(S). - SEE COMMENT. Microscopic Comment   Dr. Erroll Luna, MD, left breast central duct excision 06/20/14 Nipple drainage 1 month,clear drainage Diagnosis Breast, excision, Left - ATYPICAL DUCTAL HYPERPLASIA WITH ASSOCIATED CALCIFICATIONS, SEE COMMENT. ADDITIONAL FINDINGS: FIBROCYSTIC CHANGES WITH USUAL DUCTAL HYPERPLASIA AND ASSOCIATED CALCIFICATIONS  Past/Anticipated interventions by medical oncology, if any: Chemotherapy : Dr. Lindi Adie 10/15/15 : [facility] , saw Dr. Lindi Adie this am Lymphedema issues, if any:  NO  Pain issues, if any:  No incision healing under right breast nipple, steri strips intact,   SAFETY ISSUES:NO  Prior radiation?  NO  Pacemaker/ICD? NO  Possible current pregnancy? NO  Is the patient on methotrexate? NO  Current Complaints / other details: Married,  Menarche age 73,G1P1 age birth child 56, Birth control pills 8 years, no HRT, non smoker, drinks alcohol, no drug use, Depression,wears Hearing aid(right0 Mother age 23 pancreatic cancer with liver involvement, Maternal Great aunt   Age 10 breast cancer,  Paternal aunt fathers side age 46 breast cancer  Zamariya Eaton, RN 11/04/2015,2:09 PM  Vitals taken in med onc this am T=97.6, B/P=150/76, P=77, RR=18, 98% room air sats

## 2015-11-05 ENCOUNTER — Ambulatory Visit
Admission: RE | Admit: 2015-11-05 | Discharge: 2015-11-05 | Disposition: A | Discharge status: Home / Self Care | Payer: BC Managed Care – PPO | Source: Ambulatory Visit | Attending: Radiation Oncology | Admitting: Radiation Oncology

## 2015-11-05 ENCOUNTER — Encounter: Payer: Self-pay | Admitting: Radiation Oncology

## 2015-11-05 ENCOUNTER — Telehealth: Payer: Self-pay | Admitting: Hematology and Oncology

## 2015-11-05 ENCOUNTER — Ambulatory Visit (HOSPITAL_BASED_OUTPATIENT_CLINIC_OR_DEPARTMENT_OTHER): Payer: BC Managed Care – PPO | Admitting: Hematology and Oncology

## 2015-11-05 ENCOUNTER — Encounter: Payer: Self-pay | Admitting: Hematology and Oncology

## 2015-11-05 VITALS — BP 150/76 | HR 77 | Temp 97.6°F | Resp 19 | Wt 182.3 lb

## 2015-11-05 DIAGNOSIS — Z510 Encounter for antineoplastic radiation therapy: Secondary | ICD-10-CM | POA: Insufficient documentation

## 2015-11-05 DIAGNOSIS — C50111 Malignant neoplasm of central portion of right female breast: Secondary | ICD-10-CM | POA: Diagnosis present

## 2015-11-05 DIAGNOSIS — D0511 Intraductal carcinoma in situ of right breast: Secondary | ICD-10-CM

## 2015-11-05 DIAGNOSIS — Z170 Estrogen receptor positive status [ER+]: Secondary | ICD-10-CM | POA: Diagnosis not present

## 2015-11-05 NOTE — Telephone Encounter (Signed)
appt made and avs printed °

## 2015-11-05 NOTE — Progress Notes (Signed)
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  The patient was originally seen in multidisciplinary clinic on 10/15/15. She was felt to be a good candidate for breast conservation treatment. She has completed surgery consisting of a right lumpectomy. Final pathology revealed a 0.9 cm pTis,pNX tumor with negative margins (DCIS with calcifications, intermediate grade). The patient has met with Dr. Lindi Adie, who discussed that chemotherapy was not needed. The patient has done satisfactorily postoperatively. She is appropriate to proceed with adjuvant radiation treatment at this time followed by Tamoxifen.  ALLERGIES:  has No Known Allergies.  Meds: Current Outpatient Prescriptions  Medication Sig Dispense Refill  . acetaminophen (TYLENOL) 500 MG tablet Take 500 mg by mouth as needed.    . cetirizine (ZYRTEC) 10 MG tablet Take 10 mg by mouth at bedtime.    . cetirizine-pseudoephedrine (ZYRTEC-D) 5-120 MG per tablet Take 1 tablet by mouth every morning. Reported on 10/15/2015    . docusate sodium (COLACE) 100 MG capsule Take 100 mg by mouth daily. Takes 2 capsules at bedtime, generic brand    . escitalopram (LEXAPRO) 20 MG tablet Take 20 mg by mouth daily.    Marland Kitchen esomeprazole (NEXIUM) 40 MG capsule Take 40 mg by mouth daily at 12 noon.    Marland Kitchen estradiol (ESTRACE) 1 MG tablet Take 1 mg by mouth daily.    . fluticasone (FLONASE) 50 MCG/ACT nasal spray Place into both nostrils daily as needed. Reported on 10/15/2015    . ibuprofen (ADVIL,MOTRIN)  200 MG tablet Take 200 mg by mouth as needed.    . montelukast (SINGULAIR) 10 MG tablet Take 10 mg by mouth at bedtime.    . rosuvastatin (CRESTOR) 20 MG tablet Take 20 mg by mouth daily.     No current facility-administered medications for this encounter.    Physical Findings: The patient is in no acute distress. Patient is alert and oriented. Vitals with BMI 11/05/2015  Height   Weight 182 lbs 5 oz  BMI   Systolic 323  Diastolic 76  Pulse 77  Respirations 19   The patient is s/p right lumpectomy. The surgical incision is healing well.  Lab Findings: Lab Results  Component Value Date   WBC 6.3 10/15/2015   HGB 13.9 10/15/2015   HCT 42.5 10/15/2015   MCV 88.1 10/15/2015   PLT 265 10/15/2015     Radiographic Findings: No results found.  Impression:    The patient is status post lumpectomy as part of her breast conservation treatment strategy. The patient is appropriate to proceed with adjuvant radiation treatment at this time.  I discussed with the patient the role of radiation treatment in this setting. We discussed the expected benefit in terms of local/regional area control. We also discussed the possible side effects and risks of treatment. All of her questions were answered.  We also discussed the logistics of treatment. The patient wishes to proceed with simulation.  Plan:  The patient will be scheduled for a simulation in the  near future such that we can begin treatment planning. I anticipate treating the patient with a 4 week course of radiation treatment. This will correspond to whole breast radiation treatment to the right breast using tangent fields. The patient signed a consent form and this was placed in her medical chart.  John S. Moody, M.D., Ph.D.  This document serves as a record of services personally performed by John Moody, MD. It was created on his behalf by Jalaal Khan, a trained medical scribe. The creation of this record is based on the scribe's  personal observations and the provider's statements to them. This document has been checked and approved by the attending provider.     

## 2015-11-05 NOTE — Progress Notes (Signed)
Please see the Nurse Progress Note in the MD Initial Consult Encounter for this patient. 

## 2015-11-05 NOTE — Progress Notes (Signed)
Unable to get in to exam room prior to MD.  No assessment performed.  

## 2015-11-05 NOTE — Progress Notes (Signed)
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 She is healing very well from the surgery. She has minimal discomfort in the breast.  REVIEW OF SYSTEMS:   Constitutional: Denies fevers, chills or abnormal weight loss Eyes: Denies blurriness of vision Ears, nose, mouth, throat, and face: Denies mucositis or sore throat Respiratory: Denies cough, dyspnea or wheezes Cardiovascular: Denies palpitation, chest discomfort Gastrointestinal:  Denies nausea, heartburn or change in bowel habits Skin: Denies abnormal skin rashes Lymphatics: Denies new lymphadenopathy or easy bruising Neurological:Denies numbness, tingling or new weaknesses Behavioral/Psych: Mood is stable, no new changes  Extremities: No lower extremity edema Breast:  Recent lumpectomy All other systems were reviewed  with the patient and are negative.  I have reviewed the past medical history, past surgical history, social history and family history with the patient and they are unchanged from previous note.  ALLERGIES:  has No Known Allergies.  MEDICATIONS:  Current Outpatient Prescriptions  Medication Sig Dispense Refill  . cetirizine (ZYRTEC) 10 MG tablet Take 10 mg by mouth at bedtime.    . cetirizine-pseudoephedrine (ZYRTEC-D) 5-120 MG per tablet Take 1 tablet by mouth every morning. Reported on 10/15/2015    . docusate sodium (COLACE) 100 MG capsule Take 100 mg by mouth 2 (two) times daily.    Marland Kitchen escitalopram (LEXAPRO) 20 MG tablet Take 20 mg by mouth daily.    Marland Kitchen esomeprazole (NEXIUM) 40 MG capsule Take 40 mg by mouth daily at 12 noon.    Marland Kitchen estradiol (ESTRACE) 1 MG tablet Take 1 mg by mouth daily.    . fluticasone (FLONASE) 50 MCG/ACT nasal spray Place into both nostrils daily. Reported on 10/15/2015    . HYDROcodone-acetaminophen (NORCO) 10-325 MG tablet Take 1 tablet by mouth every 6 (six) hours as needed. 10 tablet 0  . montelukast (SINGULAIR) 10 MG tablet Take 10 mg by mouth at bedtime.    . rosuvastatin (CRESTOR) 20 MG tablet Take 20 mg by mouth daily.     No current facility-administered medications for this visit.    PHYSICAL EXAMINATION: ECOG PERFORMANCE STATUS: 1 - Symptomatic but completely ambulatory  Filed Vitals:   11/05/15 0909  BP: 150/76  Pulse: 77  Temp: 97.6 F (36.4 C)  Resp: 19   Filed Weights   11/05/15 0909  Weight: 182 lb 4.8 oz (82.691 kg)    GENERAL:alert, no distress and comfortable SKIN: skin color, texture, turgor are normal, no rashes or significant  lesions EYES: normal, Conjunctiva are pink and non-injected, sclera clear OROPHARYNX:no exudate, no erythema and lips, buccal mucosa, and tongue normal  NECK: supple, thyroid normal size, non-tender, without nodularity LYMPH:  no palpable lymphadenopathy in the cervical, axillary or inguinal LUNGS: clear to  auscultation and percussion with normal breathing effort HEART: regular rate & rhythm and no murmurs and no lower extremity edema ABDOMEN:abdomen soft, non-tender and normal bowel sounds MUSCULOSKELETAL:no cyanosis of digits and no clubbing  NEURO: alert & oriented x 3 with fluent speech, no focal motor/sensory deficits EXTREMITIES: No lower extremity edema  LABORATORY DATA:  I have reviewed the data as listed   Chemistry      Component Value Date/Time   NA 138 10/15/2015 1220   K 4.0 10/15/2015 1220   CO2 27 10/15/2015 1220   BUN 12.5 10/15/2015 1220   CREATININE 0.9 10/15/2015 1220      Component Value Date/Time   CALCIUM 9.3 10/15/2015 1220   ALKPHOS 86 10/15/2015 1220   AST 13 10/15/2015 1220   ALT 12 10/15/2015 1220   BILITOT <0.30 10/15/2015 1220       Lab Results  Component Value Date   WBC 6.3 10/15/2015   HGB 13.9 10/15/2015   HCT 42.5 10/15/2015   MCV 88.1 10/15/2015   PLT 265 10/15/2015   NEUTROABS 3.3 10/15/2015   ASSESSMENT & PLAN:  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 I discussed the margins as well as lymph node surgeries. We also discussed the final staging along with previously performed ER/PR testing.  Treatment Plan: 1. Adj XRT 2. Foll by Tamoxifen  RTC in 3 months to start Tamoxifen.   No orders of the defined types were placed in this encounter.   The patient has a good understanding of the overall plan. she agrees with it. she will call with any problems that may develop before the next visit here.   Rulon Eisenmenger, MD 11/05/2015

## 2015-11-10 ENCOUNTER — Ambulatory Visit
Admission: RE | Admit: 2015-11-10 | Discharge: 2015-11-10 | Disposition: A | Discharge status: Home / Self Care | Payer: BC Managed Care – PPO | Source: Ambulatory Visit | Attending: Radiation Oncology | Admitting: Radiation Oncology

## 2015-11-10 DIAGNOSIS — C50111 Malignant neoplasm of central portion of right female breast: Secondary | ICD-10-CM

## 2015-11-10 DIAGNOSIS — Z510 Encounter for antineoplastic radiation therapy: Secondary | ICD-10-CM | POA: Diagnosis not present

## 2015-11-12 NOTE — Progress Notes (Signed)
  Radiation Oncology         [phone] ________________________________  Name: Melody Harding MRN: [id number]  Date: 11/10/2015  DOB: February 18, 1951  Optical Surface Tracking Plan:  Since intensity modulated radiotherapy (IMRT) and 3D conformal radiation treatment methods are predicated on accurate and precise positioning for treatment, intrafraction motion monitoring is medically necessary to ensure accurate and safe treatment delivery.  The ability to quantify intrafraction motion without excessive ionizing radiation dose can only be performed with optical surface tracking. Accordingly, surface imaging offers the opportunity to obtain 3D measurements of patient position throughout IMRT and 3D treatments without excessive radiation exposure.  I am ordering optical surface tracking for this patient's upcoming course of radiotherapy. ________________________________  Kyung Rudd, MD 11/12/2015 8:38 AM    Reference:   Particia Jasper, et al. Surface imaging-based analysis of intrafraction motion for breast radiotherapy patients.Journal of [place], n. 6, nov. 2014. ISSN [id number].   Available at: <http://www.jacmp.org/index.php/jacmp/article/view/4957>.

## 2015-11-12 NOTE — Progress Notes (Signed)
  Radiation Oncology         [phone] ________________________________  Name: Melody Harding MRN: [id number]  Date: 11/10/2015  DOB: 09/07/1950   DIAGNOSIS:     ICD-9-CM ICD-10-CM   1. 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 The patient was placed in a supine position on a breast board. A customized vac-lock bag was constructed and this complex treatment device will be used on a daily basis during her treatment. In this fashion, a CT scan was obtained through the chest area and an isocenter was placed near the chest wall within the breast.  The patient will be planned to receive a course of radiation initially to a dose of 42.5 Gy. This will consist of a whole breast radiotherapy technique. To accomplish this, 2 customized blocks have been designed which will correspond to medial and lateral whole breast tangent fields. This treatment will be accomplished at 2.5 Gy per fraction. A forward planning technique will also be evaluated to determine if this approach improves the plan. It is anticipated that the patient will then receive a 7.5 Gy boost to the seroma cavity which has been contoured. This will be accomplished at 2.5 Gy per fraction.   This initial treatment will consist of a 3-D conformal technique. The seroma has been contoured as the primary target structure. Additionally, dose volume histograms of both this target as well as the lungs and heart will also be evaluated. Such an approach is necessary to ensure that the target area is adequately covered while the nearby critical  normal structures are adequately spared.  Plan:  The final anticipated total dose therefore will correspond to 50 Gy.    _______________________________   Jodelle Gross, MD, PhD

## 2015-11-13 DIAGNOSIS — Z510 Encounter for antineoplastic radiation therapy: Secondary | ICD-10-CM | POA: Diagnosis not present

## 2015-11-17 ENCOUNTER — Ambulatory Visit
Admission: RE | Admit: 2015-11-17 | Discharge: 2015-11-17 | Disposition: A | Discharge status: Home / Self Care | Payer: BC Managed Care – PPO | Source: Ambulatory Visit | Attending: Radiation Oncology | Admitting: Radiation Oncology

## 2015-11-17 DIAGNOSIS — Z510 Encounter for antineoplastic radiation therapy: Secondary | ICD-10-CM | POA: Diagnosis not present

## 2015-11-17 DIAGNOSIS — C4491 Basal cell carcinoma of skin, unspecified: Secondary | ICD-10-CM

## 2015-11-17 HISTORY — DX: Basal cell carcinoma of skin, unspecified: C44.91

## 2015-11-18 ENCOUNTER — Ambulatory Visit
Admission: RE | Admit: 2015-11-18 | Discharge: 2015-11-18 | Disposition: A | Discharge status: Home / Self Care | Payer: BC Managed Care – PPO | Source: Ambulatory Visit | Attending: Radiation Oncology | Admitting: Radiation Oncology

## 2015-11-18 DIAGNOSIS — C50111 Malignant neoplasm of central portion of right female breast: Secondary | ICD-10-CM

## 2015-11-18 DIAGNOSIS — Z510 Encounter for antineoplastic radiation therapy: Secondary | ICD-10-CM | POA: Diagnosis not present

## 2015-11-18 MED ORDER — RADIAPLEXRX EX GEL
Freq: Once | CUTANEOUS | Status: AC
  Administered 2015-11-18: 13:00:00 via TOPICAL

## 2015-11-18 MED ORDER — ALRA NON-METALLIC DEODORANT (RAD-ONC)
1.0000 "application " | Freq: Once | TOPICAL | Status: AC
  Administered 2015-11-18: 1 via TOPICAL

## 2015-11-18 NOTE — Progress Notes (Signed)
Pt education breast: Radiation therapy and you book, my business card, alra deodorant,rdaiplex gel given to patient, discussed ways to manage side effects, skin irritation,pain, swelling/soreness, fatigue, encourage water stay hydrated, increase protein in diet, can use electric shaver if needed,  Will see MD weekly and prn, use radaiplex to affected breat after rad txs and bedtime,alra prn after radtx, teach back given 1:31 PM

## 2015-11-19 ENCOUNTER — Ambulatory Visit: Payer: BC Managed Care – PPO

## 2015-11-19 ENCOUNTER — Ambulatory Visit
Admission: RE | Admit: 2015-11-19 | Discharge: 2015-11-19 | Disposition: A | Discharge status: Home / Self Care | Payer: BC Managed Care – PPO | Source: Ambulatory Visit | Attending: Radiation Oncology | Admitting: Radiation Oncology

## 2015-11-20 ENCOUNTER — Ambulatory Visit
Admission: RE | Admit: 2015-11-20 | Discharge: 2015-11-20 | Disposition: A | Discharge status: Home / Self Care | Payer: BC Managed Care – PPO | Source: Ambulatory Visit | Attending: Radiation Oncology | Admitting: Radiation Oncology

## 2015-11-20 DIAGNOSIS — Z510 Encounter for antineoplastic radiation therapy: Secondary | ICD-10-CM | POA: Diagnosis not present

## 2015-11-21 ENCOUNTER — Ambulatory Visit
Admission: RE | Admit: 2015-11-21 | Discharge: 2015-11-21 | Disposition: A | Discharge status: Home / Self Care | Payer: BC Managed Care – PPO | Source: Ambulatory Visit | Attending: Radiation Oncology | Admitting: Radiation Oncology

## 2015-11-21 ENCOUNTER — Encounter: Payer: Self-pay | Admitting: Radiation Oncology

## 2015-11-21 VITALS — BP 154/69 | HR 68 | Temp 98.0°F | Resp 20 | Wt 182.5 lb

## 2015-11-21 DIAGNOSIS — Z510 Encounter for antineoplastic radiation therapy: Secondary | ICD-10-CM | POA: Diagnosis not present

## 2015-11-21 DIAGNOSIS — C50111 Malignant neoplasm of central portion of right female breast: Secondary | ICD-10-CM

## 2015-11-21 NOTE — Progress Notes (Signed)
   Department of Radiation Oncology  Phone:  [phone] Fax:        [phone]  Weekly Treatment Note    Name: Melody Harding Date: 11/21/2015 MRN: [id number] DOB: 07/09/1951   Diagnosis:     ICD-9-CM ICD-10-CM   1. 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 acetaminophen (TYLENOL) 500 MG tablet Take 500 mg by mouth as needed. Reported on 11/18/2015    . cetirizine (ZYRTEC) 10 MG tablet Take 10 mg by mouth at bedtime.    . cetirizine-pseudoephedrine (ZYRTEC-D) 5-120 MG per tablet Take 1 tablet by mouth every morning. Reported on 11/18/2015    . docusate sodium (COLACE) 100 MG capsule Take 100 mg by mouth daily. Takes 2 capsules at bedtime, generic brand    . escitalopram (LEXAPRO) 20 MG tablet Take 20 mg by mouth daily.    Marland Kitchen esomeprazole (NEXIUM) 40 MG capsule Take 40 mg by mouth daily at 12 noon.    . fluticasone (FLONASE) 50 MCG/ACT nasal spray Place into both nostrils daily as needed. Reported on 10/15/2015    . hyaluronate sodium (RADIAPLEXRX) GEL Apply 1 application topically 2 (two) times daily.    Marland Kitchen ibuprofen (ADVIL,MOTRIN) 200 MG tablet Take 200 mg by mouth as needed. Reported on 11/18/2015    . montelukast (SINGULAIR) 10 MG tablet Take 10 mg by mouth at bedtime.    . non-metallic deodorant [name]) MISC Apply 1 application topically daily as needed.    . rosuvastatin (CRESTOR) 20 MG tablet Take 20 mg by mouth daily.     No current facility-administered medications for this encounter.     ALLERGIES: Review of patient's allergies indicates no known allergies.   LABORATORY DATA:  Lab Results  Component Value Date   WBC 6.3 10/15/2015   HGB 13.9 10/15/2015   HCT 42.5 10/15/2015   MCV 88.1 10/15/2015   PLT 265 10/15/2015   Lab Results  Component Value Date   NA 138 10/15/2015   K 4.0 10/15/2015   CO2 27  10/15/2015   Lab Results  Component Value Date   ALT 12 10/15/2015   AST 13 10/15/2015   ALKPHOS 86 10/15/2015   BILITOT <0.30 10/15/2015     NARRATIVE: Melody Harding was seen today for weekly treatment management. The chart was checked and the patient's films were reviewed.  Weekly rad txs,  Right breast   3/20  Completed No skin changes, using radiaplex bid, no c/o pain, appetite good,  No fatigue   Stated , some swelling in breast BP 154/69 mmHg  Pulse 68  Temp(Src) 98 F (36.7 C) (Oral)  Resp 20  Wt 182 lb 8 oz (82.781 kg)  Wt Readings from Last 3 Encounters:  11/21/15 182 lb 8 oz (82.781 kg)  11/05/15 182 lb 4.8 oz (82.691 kg)  10/22/15 183 lb (83.008 kg)    PHYSICAL EXAMINATION: weight is 182 lb 8 oz (82.781 kg). Her oral temperature is 98 F (36.7 C). Her blood pressure is 154/69 and her pulse is 68. Her respiration is 20.        ASSESSMENT: The patient is doing satisfactorily with treatment.  PLAN: We will continue with the patient's radiation treatment as planned.

## 2015-11-21 NOTE — Progress Notes (Signed)
Weekly rad txs breast   No skin cahnges, radiaplex given 11/18/15 with pt education

## 2015-11-21 NOTE — Progress Notes (Signed)
Weekly rad txs,  Right breast   3/20  Completed No skin changes, using radiaplex bid, no c/o pain, appetite good,  No fatigue   Stated , some swelling in breast BP 154/69 mmHg  Pulse 68  Temp(Src) 98 F (36.7 C) (Oral)  Resp 20  Wt 182 lb 8 oz (82.781 kg)  Wt Readings from Last 3 Encounters:  11/21/15 182 lb 8 oz (82.781 kg)  11/05/15 182 lb 4.8 oz (82.691 kg)  10/22/15 183 lb (83.008 kg)

## 2015-11-24 ENCOUNTER — Ambulatory Visit
Admission: RE | Admit: 2015-11-24 | Discharge: 2015-11-24 | Disposition: A | Discharge status: Home / Self Care | Payer: BC Managed Care – PPO | Source: Ambulatory Visit | Attending: Radiation Oncology | Admitting: Radiation Oncology

## 2015-11-24 DIAGNOSIS — Z510 Encounter for antineoplastic radiation therapy: Secondary | ICD-10-CM | POA: Diagnosis not present

## 2015-11-25 ENCOUNTER — Ambulatory Visit
Admission: RE | Admit: 2015-11-25 | Discharge: 2015-11-25 | Disposition: A | Discharge status: Home / Self Care | Payer: BC Managed Care – PPO | Source: Ambulatory Visit | Attending: Radiation Oncology | Admitting: Radiation Oncology

## 2015-11-25 DIAGNOSIS — Z510 Encounter for antineoplastic radiation therapy: Secondary | ICD-10-CM | POA: Diagnosis not present

## 2015-11-26 ENCOUNTER — Ambulatory Visit
Admission: RE | Admit: 2015-11-26 | Discharge: 2015-11-26 | Disposition: A | Discharge status: Home / Self Care | Payer: BC Managed Care – PPO | Source: Ambulatory Visit | Attending: Radiation Oncology | Admitting: Radiation Oncology

## 2015-11-26 DIAGNOSIS — Z510 Encounter for antineoplastic radiation therapy: Secondary | ICD-10-CM | POA: Diagnosis not present

## 2015-11-27 ENCOUNTER — Ambulatory Visit
Admission: RE | Admit: 2015-11-27 | Discharge: 2015-11-27 | Disposition: A | Discharge status: Home / Self Care | Payer: BC Managed Care – PPO | Source: Ambulatory Visit | Attending: Radiation Oncology | Admitting: Radiation Oncology

## 2015-11-27 ENCOUNTER — Telehealth: Payer: Self-pay | Admitting: *Deleted

## 2015-11-27 DIAGNOSIS — Z510 Encounter for antineoplastic radiation therapy: Secondary | ICD-10-CM | POA: Diagnosis not present

## 2015-11-27 NOTE — Telephone Encounter (Signed)
Left message for a return phone call to follow up after start of radiation.   

## 2015-11-28 ENCOUNTER — Ambulatory Visit: Payer: BC Managed Care – PPO

## 2015-11-28 ENCOUNTER — Ambulatory Visit
Admission: RE | Admit: 2015-11-28 | Discharge: 2015-11-28 | Disposition: A | Discharge status: Home / Self Care | Payer: BC Managed Care – PPO | Source: Ambulatory Visit | Attending: Radiation Oncology | Admitting: Radiation Oncology

## 2015-11-28 ENCOUNTER — Ambulatory Visit: Payer: BC Managed Care – PPO | Attending: Radiation Oncology | Admitting: Radiation Oncology

## 2015-12-01 ENCOUNTER — Ambulatory Visit
Admission: RE | Admit: 2015-12-01 | Discharge: 2015-12-01 | Disposition: A | Discharge status: Home / Self Care | Payer: BC Managed Care – PPO | Source: Ambulatory Visit | Attending: Radiation Oncology | Admitting: Radiation Oncology

## 2015-12-01 ENCOUNTER — Encounter: Payer: Self-pay | Admitting: Radiation Oncology

## 2015-12-01 VITALS — BP 142/71 | HR 74 | Temp 97.9°F | Resp 16 | Wt 182.6 lb

## 2015-12-01 DIAGNOSIS — Z510 Encounter for antineoplastic radiation therapy: Secondary | ICD-10-CM | POA: Diagnosis not present

## 2015-12-01 DIAGNOSIS — C50111 Malignant neoplasm of central portion of right female breast: Secondary | ICD-10-CM

## 2015-12-01 NOTE — Progress Notes (Signed)
Weekly rad txs right breast 8/20 completed, missed Friday had the flu, just feels weak but improving, no fever, skin looks good, slight itching on subclavicular side, mild erythmea uses radiaplex bid, no pain 10:47 AM BP 142/71 mmHg  Pulse 74  Temp(Src) 97.9 F (36.6 C) (Oral)  Resp 16  Wt 182 lb 9.6 oz (82.827 kg)  Wt Readings from Last 3 Encounters:  12/01/15 182 lb 9.6 oz (82.827 kg)  11/21/15 182 lb 8 oz (82.781 kg)  11/05/15 182 lb 4.8 oz (82.691 kg)

## 2015-12-01 NOTE — Progress Notes (Signed)
Department of Radiation Oncology  Phone:  [phone] Fax:        [phone]  Weekly Treatment Note    Name: Melody Harding Date: 12/01/2015 MRN: [id number] DOB: 1951/06/04   Diagnosis:     ICD-9-CM ICD-10-CM   1. 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 acetaminophen (TYLENOL) 500 MG tablet Take 500 mg by mouth as needed. Reported on 11/18/2015    . cetirizine (ZYRTEC) 10 MG tablet Take 10 mg by mouth at bedtime.    . cetirizine-pseudoephedrine (ZYRTEC-D) 5-120 MG per tablet Take 1 tablet by mouth every morning. Reported on 11/18/2015    . docusate sodium (COLACE) 100 MG capsule Take 100 mg by mouth daily. Takes 2 capsules at bedtime, generic brand    . escitalopram (LEXAPRO) 20 MG tablet Take 20 mg by mouth daily.    Marland Kitchen esomeprazole (NEXIUM) 40 MG capsule Take 40 mg by mouth daily at 12 noon.    . fluticasone (FLONASE) 50 MCG/ACT nasal spray Place into both nostrils daily as needed. Reported on 10/15/2015    . hyaluronate sodium (RADIAPLEXRX) GEL Apply 1 application topically 2 (two) times daily.    Marland Kitchen ibuprofen (ADVIL,MOTRIN) 200 MG tablet Take 200 mg by mouth as needed. Reported on 11/18/2015    . montelukast (SINGULAIR) 10 MG tablet Take 10 mg by mouth at bedtime.    . non-metallic deodorant [name]) MISC Apply 1 application topically daily as needed.    . rosuvastatin (CRESTOR) 20 MG tablet Take 20 mg by mouth daily.     No current facility-administered medications for this encounter.     ALLERGIES: Review of patient's allergies indicates no known allergies.   LABORATORY DATA:  Lab Results  Component Value Date   WBC 6.3 10/15/2015   HGB 13.9 10/15/2015   HCT 42.5 10/15/2015   MCV 88.1 10/15/2015   PLT 265 10/15/2015   Lab Results  Component Value Date   NA 138 10/15/2015   K 4.0 10/15/2015   CO2 27  10/15/2015   Lab Results  Component Value Date   ALT 12 10/15/2015   AST 13 10/15/2015   ALKPHOS 86 10/15/2015   BILITOT <0.30 10/15/2015     NARRATIVE: Melody Harding was seen today for weekly treatment management. The chart was checked and the patient's films were reviewed.  Weekly rad txs right breast 8/20 completed. She missed her visit on Friday as she had the flu. She just feels weak but improving. No fever. Skin looks good. Slight itching on subclavicular side. Mild erythmea uses radiaplex bid. Denies pain.   BP 142/71 mmHg  Pulse 74  Temp(Src) 97.9 F (36.6 C) (Oral)  Resp 16  Wt 182 lb 9.6 oz (82.827 kg)  Wt Readings from Last 3 Encounters:  12/01/15 182 lb 9.6 oz (82.827 kg)  11/21/15 182 lb 8 oz (82.781 kg)  11/05/15 182 lb 4.8 oz (82.691 kg)    PHYSICAL EXAMINATION: weight is 182 lb 9.6 oz (82.827 kg). Her oral temperature is 97.9 F (36.6 C). Her blood pressure is 142/71 and her pulse is 74. Her respiration is 16.       Minimal radiation change with no desquamation  ASSESSMENT: The patient is doing satisfactorily with treatment.  PLAN: We will continue with the patient's radiation treatment as planned. She will try minimal cortisol  cream on an itchy skin area.  ------------------------------------------------  Jodelle Gross, MD, PhD     This document serves as a record of services personally performed by Kyung Rudd, MD. It was created on his behalf by Arlyce Harman, a trained medical scribe. The creation of this record is based on the scribe's personal observations and the provider's statements to them. This document has been checked and approved by the attending provider.

## 2015-12-02 ENCOUNTER — Ambulatory Visit
Admission: RE | Admit: 2015-12-02 | Discharge: 2015-12-02 | Disposition: A | Discharge status: Home / Self Care | Payer: BC Managed Care – PPO | Source: Ambulatory Visit | Attending: Radiation Oncology | Admitting: Radiation Oncology

## 2015-12-02 DIAGNOSIS — Z510 Encounter for antineoplastic radiation therapy: Secondary | ICD-10-CM | POA: Diagnosis not present

## 2015-12-03 ENCOUNTER — Ambulatory Visit
Admission: RE | Admit: 2015-12-03 | Discharge: 2015-12-03 | Disposition: A | Discharge status: Home / Self Care | Payer: BC Managed Care – PPO | Source: Ambulatory Visit | Attending: Radiation Oncology | Admitting: Radiation Oncology

## 2015-12-03 DIAGNOSIS — Z510 Encounter for antineoplastic radiation therapy: Secondary | ICD-10-CM | POA: Diagnosis not present

## 2015-12-04 ENCOUNTER — Encounter: Payer: Self-pay | Admitting: Radiation Oncology

## 2015-12-04 ENCOUNTER — Ambulatory Visit
Admission: RE | Admit: 2015-12-04 | Discharge: 2015-12-04 | Disposition: A | Discharge status: Home / Self Care | Payer: BC Managed Care – PPO | Source: Ambulatory Visit | Attending: Radiation Oncology | Admitting: Radiation Oncology

## 2015-12-04 DIAGNOSIS — Z510 Encounter for antineoplastic radiation therapy: Secondary | ICD-10-CM | POA: Diagnosis not present

## 2015-12-05 ENCOUNTER — Ambulatory Visit
Admission: RE | Admit: 2015-12-05 | Discharge: 2015-12-05 | Disposition: A | Discharge status: Home / Self Care | Payer: BC Managed Care – PPO | Source: Ambulatory Visit | Attending: Radiation Oncology | Admitting: Radiation Oncology

## 2015-12-05 ENCOUNTER — Encounter: Payer: Self-pay | Admitting: Radiation Oncology

## 2015-12-05 VITALS — BP 138/81 | HR 76 | Temp 97.6°F | Resp 16 | Wt 182.5 lb

## 2015-12-05 DIAGNOSIS — C50111 Malignant neoplasm of central portion of right female breast: Secondary | ICD-10-CM

## 2015-12-05 DIAGNOSIS — Z510 Encounter for antineoplastic radiation therapy: Secondary | ICD-10-CM | POA: Diagnosis not present

## 2015-12-05 NOTE — Progress Notes (Addendum)
Weekly rad  tx right breast  12/20 completed,  Mild erythema ,skin intact, using radaiplex bid, appetite good, mild fatigue, takes naps now no c/o pain 10:36 AM BP 138/81 mmHg  Pulse 76  Temp(Src) 97.6 F (36.4 C) (Oral)  Resp 16  Wt 182 lb 8 oz (82.781 kg)  Wt Readings from Last 3 Encounters:  12/05/15 182 lb 8 oz (82.781 kg)  12/01/15 182 lb 9.6 oz (82.827 kg)  11/21/15 182 lb 8 oz (82.781 kg)

## 2015-12-05 NOTE — Progress Notes (Signed)
   Department of Radiation Oncology  Phone:  [phone] Fax:        [phone]  Weekly Treatment Note    Name: Melody Harding Date: 12/05/2015 MRN: [id number] DOB: 1951-03-15   Diagnosis:     ICD-9-CM ICD-10-CM   1. 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 acetaminophen (TYLENOL) 500 MG tablet Take 500 mg by mouth as needed. Reported on 11/18/2015    . cetirizine (ZYRTEC) 10 MG tablet Take 10 mg by mouth at bedtime.    . cetirizine-pseudoephedrine (ZYRTEC-D) 5-120 MG per tablet Take 1 tablet by mouth every morning. Reported on 11/18/2015    . docusate sodium (COLACE) 100 MG capsule Take 100 mg by mouth daily. Takes 2 capsules at bedtime, generic brand    . escitalopram (LEXAPRO) 20 MG tablet Take 20 mg by mouth daily.    Marland Kitchen esomeprazole (NEXIUM) 40 MG capsule Take 40 mg by mouth daily at 12 noon.    . fluticasone (FLONASE) 50 MCG/ACT nasal spray Place into both nostrils daily as needed. Reported on 10/15/2015    . hyaluronate sodium (RADIAPLEXRX) GEL Apply 1 application topically 2 (two) times daily.    Marland Kitchen ibuprofen (ADVIL,MOTRIN) 200 MG tablet Take 200 mg by mouth as needed. Reported on 11/18/2015    . montelukast (SINGULAIR) 10 MG tablet Take 10 mg by mouth at bedtime.    . non-metallic deodorant [name]) MISC Apply 1 application topically daily as needed.    . rosuvastatin (CRESTOR) 20 MG tablet Take 20 mg by mouth daily.     No current facility-administered medications for this encounter.     ALLERGIES: Review of patient's allergies indicates no known allergies.   LABORATORY DATA:  Lab Results  Component Value Date   WBC 6.3 10/15/2015   HGB 13.9 10/15/2015   HCT 42.5 10/15/2015   MCV 88.1 10/15/2015   PLT 265 10/15/2015   Lab Results  Component Value Date   NA 138 10/15/2015   K 4.0 10/15/2015   CO2 27  10/15/2015   Lab Results  Component Value Date   ALT 12 10/15/2015   AST 13 10/15/2015   ALKPHOS 86 10/15/2015   BILITOT <0.30 10/15/2015     NARRATIVE: Melody Harding was seen today for weekly treatment management. The chart was checked and the patient's films were reviewed.  Weekly rad  tx right breast  12/20 completed,  Mild erythema ,skin intact, using radaiplex bid, appetite good, mild fatigue, takes naps now no c/o pain 11:17 AM BP 138/81 mmHg  Pulse 76  Temp(Src) 97.6 F (36.4 C) (Oral)  Resp 16  Wt 182 lb 8 oz (82.781 kg)  Wt Readings from Last 3 Encounters:  12/05/15 182 lb 8 oz (82.781 kg)  12/01/15 182 lb 9.6 oz (82.827 kg)  11/21/15 182 lb 8 oz (82.781 kg)    PHYSICAL EXAMINATION: weight is 182 lb 8 oz (82.781 kg). Her oral temperature is 97.6 F (36.4 C). Her blood pressure is 138/81 and her pulse is 76. Her respiration is 16.      No moist desquamation present. Overall her skin looks very good.  ASSESSMENT: The patient is doing satisfactorily with treatment.  PLAN: We will continue with the patient's radiation treatment as planned.

## 2015-12-08 ENCOUNTER — Ambulatory Visit
Admission: RE | Admit: 2015-12-08 | Discharge: 2015-12-08 | Disposition: A | Discharge status: Home / Self Care | Payer: BC Managed Care – PPO | Source: Ambulatory Visit | Attending: Radiation Oncology | Admitting: Radiation Oncology

## 2015-12-08 ENCOUNTER — Ambulatory Visit
Admission: RE | Admit: 2015-12-08 | Payer: BC Managed Care – PPO | Source: Ambulatory Visit | Admitting: Radiation Oncology

## 2015-12-08 DIAGNOSIS — Z510 Encounter for antineoplastic radiation therapy: Secondary | ICD-10-CM | POA: Diagnosis not present

## 2015-12-09 ENCOUNTER — Ambulatory Visit
Admission: RE | Admit: 2015-12-09 | Discharge: 2015-12-09 | Disposition: A | Discharge status: Home / Self Care | Payer: BC Managed Care – PPO | Source: Ambulatory Visit | Attending: Radiation Oncology | Admitting: Radiation Oncology

## 2015-12-09 DIAGNOSIS — Z510 Encounter for antineoplastic radiation therapy: Secondary | ICD-10-CM | POA: Diagnosis not present

## 2015-12-10 ENCOUNTER — Ambulatory Visit
Admission: RE | Admit: 2015-12-10 | Discharge: 2015-12-10 | Disposition: A | Discharge status: Home / Self Care | Payer: BC Managed Care – PPO | Source: Ambulatory Visit | Attending: Radiation Oncology | Admitting: Radiation Oncology

## 2015-12-10 DIAGNOSIS — Z510 Encounter for antineoplastic radiation therapy: Secondary | ICD-10-CM | POA: Diagnosis not present

## 2015-12-11 ENCOUNTER — Ambulatory Visit
Admission: RE | Admit: 2015-12-11 | Discharge: 2015-12-11 | Disposition: A | Discharge status: Home / Self Care | Payer: BC Managed Care – PPO | Source: Ambulatory Visit | Attending: Radiation Oncology | Admitting: Radiation Oncology

## 2015-12-11 ENCOUNTER — Ambulatory Visit: Payer: BC Managed Care – PPO

## 2015-12-11 DIAGNOSIS — Z510 Encounter for antineoplastic radiation therapy: Secondary | ICD-10-CM | POA: Diagnosis not present

## 2015-12-12 ENCOUNTER — Ambulatory Visit
Admission: RE | Admit: 2015-12-12 | Discharge: 2015-12-12 | Disposition: A | Discharge status: Home / Self Care | Payer: BC Managed Care – PPO | Source: Ambulatory Visit | Attending: Radiation Oncology | Admitting: Radiation Oncology

## 2015-12-12 ENCOUNTER — Encounter: Payer: Self-pay | Admitting: Radiation Oncology

## 2015-12-12 ENCOUNTER — Ambulatory Visit: Payer: BC Managed Care – PPO | Admitting: Radiation Oncology

## 2015-12-12 ENCOUNTER — Ambulatory Visit: Payer: BC Managed Care – PPO

## 2015-12-12 VITALS — BP 174/54 | HR 73 | Temp 98.0°F | Resp 20 | Wt 182.0 lb

## 2015-12-12 DIAGNOSIS — Z510 Encounter for antineoplastic radiation therapy: Secondary | ICD-10-CM | POA: Diagnosis not present

## 2015-12-12 DIAGNOSIS — C50111 Malignant neoplasm of central portion of right female breast: Secondary | ICD-10-CM

## 2015-12-12 NOTE — Progress Notes (Signed)
Complex simulation note  Diagnosis: right-sided breast cancer  Narrative The patient has initially been planned to receive a course of whole breast radiation to a dose of 42.5 Gy in 17 fractions. The patient will now receive an additional boost to the seroma cavity which has been contoured. This will correspond to a boost of 7.5 Gy at 2.5 Gy per fraction. To accomplish this, an additional 3 customized blocks have been designed for this purpose. A complex isodose plan is requested to ensure that the target area is adequately covered with radiation dose and that the nearby normal structures such as the lung are adequately spared. The patient's final total dose will be 50 Gy.  ------------------------------------------------  Eaton Folmar S. Deanta Mincey, MD, PhD   

## 2015-12-12 NOTE — Progress Notes (Signed)
Department of Radiation Oncology  Phone:  [phone] Fax:        [phone]  Weekly Treatment Note    Name: Melody Harding Date: 12/12/2015 MRN: [id number] DOB: 10-26-1950   Diagnosis:     ICD-9-CM ICD-10-CM   1. 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 acetaminophen (TYLENOL) 500 MG tablet Take 500 mg by mouth as needed. Reported on 11/18/2015    . cetirizine (ZYRTEC) 10 MG tablet Take 10 mg by mouth at bedtime.    . cetirizine-pseudoephedrine (ZYRTEC-D) 5-120 MG per tablet Take 1 tablet by mouth every morning. Reported on 11/18/2015    . docusate sodium (COLACE) 100 MG capsule Take 100 mg by mouth daily. Takes 2 capsules at bedtime, generic brand    . escitalopram (LEXAPRO) 20 MG tablet Take 20 mg by mouth daily.    Marland Kitchen esomeprazole (NEXIUM) 40 MG capsule Take 40 mg by mouth daily at 12 noon.    . fluticasone (FLONASE) 50 MCG/ACT nasal spray Place into both nostrils daily as needed. Reported on 10/15/2015    . hyaluronate sodium (RADIAPLEXRX) GEL Apply 1 application topically 2 (two) times daily.    Marland Kitchen ibuprofen (ADVIL,MOTRIN) 200 MG tablet Take 200 mg by mouth as needed. Reported on 11/18/2015    . montelukast (SINGULAIR) 10 MG tablet Take 10 mg by mouth at bedtime.    . non-metallic deodorant [name]) MISC Apply 1 application topically daily as needed.    . rosuvastatin (CRESTOR) 20 MG tablet Take 20 mg by mouth daily.     No current facility-administered medications for this encounter.     ALLERGIES: Review of patient's allergies indicates no known allergies.   LABORATORY DATA:  Lab Results  Component Value Date   WBC 6.3 10/15/2015   HGB 13.9 10/15/2015   HCT 42.5 10/15/2015   MCV 88.1 10/15/2015   PLT 265 10/15/2015   Lab Results  Component Value Date   NA 138 10/15/2015   K 4.0 10/15/2015   CO2 27  10/15/2015   Lab Results  Component Value Date   ALT 12 10/15/2015   AST 13 10/15/2015   ALKPHOS 86 10/15/2015   BILITOT <0.30 10/15/2015     NARRATIVE: JISSEL GATHINGS was seen today for weekly treatment management. The chart was checked and the patient's films were reviewed.  Weekly rad txs right breast 17/20 completed, erythmea, dryness and itching,using radiaplex bid, looks like skin might peel on top of central breast, appetite googd, f/u appt 02/05/16 after appt with Dr. Lindi Adie , energy level is okay stated, takes 15-20 minute naps during the day 11:10 AM BP 174/54 mmHg  Pulse 73  Temp(Src) 98 F (36.7 C) (Oral)  Resp 20  Wt 182 lb (82.555 kg)  Wt Readings from Last 3 Encounters:  12/12/15 182 lb (82.555 kg)  12/05/15 182 lb 8 oz (82.781 kg)  12/01/15 182 lb 9.6 oz (82.827 kg)    PHYSICAL EXAMINATION: weight is 182 lb (82.555 kg). Her oral temperature is 98 F (36.7 C). Her blood pressure is 174/54 and her pulse is 73. Her respiration is 20.      The patient's skin shows some hyperpigmentation and diffuse mild erythema. Overall her skin looks excellent for where she is in her treatment.  ASSESSMENT: The patient is doing satisfactorily with treatment.  PLAN:  We will continue with the patient's radiation treatment as planned. The patient will finish her treatment next week and follow-up clinic in 1 month.

## 2015-12-12 NOTE — Progress Notes (Signed)
Weekly rad txs right breast 17/20 completed, erythmea, dryness and itching,using radiaplex bid, looks like skin might peel on top of central breast, appetite googd, f/u appt 02/05/16 after appt with Dr. Lindi Adie , energy level is okay stated, takes 15-20 minute naps during the day 10:16 AM BP 174/54 mmHg  Pulse 73  Temp(Src) 98 F (36.7 C) (Oral)  Resp 20  Wt 182 lb (82.555 kg)  Wt Readings from Last 3 Encounters:  12/12/15 182 lb (82.555 kg)  12/05/15 182 lb 8 oz (82.781 kg)  12/01/15 182 lb 9.6 oz (82.827 kg)

## 2015-12-15 ENCOUNTER — Ambulatory Visit
Admission: RE | Admit: 2015-12-15 | Discharge: 2015-12-15 | Disposition: A | Discharge status: Home / Self Care | Payer: BC Managed Care – PPO | Source: Ambulatory Visit | Attending: Radiation Oncology | Admitting: Radiation Oncology

## 2015-12-15 ENCOUNTER — Ambulatory Visit: Payer: BC Managed Care – PPO

## 2015-12-15 DIAGNOSIS — Z510 Encounter for antineoplastic radiation therapy: Secondary | ICD-10-CM | POA: Diagnosis not present

## 2015-12-16 ENCOUNTER — Ambulatory Visit: Payer: BC Managed Care – PPO

## 2015-12-16 ENCOUNTER — Ambulatory Visit
Admission: RE | Admit: 2015-12-16 | Discharge: 2015-12-16 | Disposition: A | Discharge status: Home / Self Care | Payer: BC Managed Care – PPO | Source: Ambulatory Visit | Attending: Radiation Oncology | Admitting: Radiation Oncology

## 2015-12-16 DIAGNOSIS — Z510 Encounter for antineoplastic radiation therapy: Secondary | ICD-10-CM | POA: Diagnosis not present

## 2015-12-16 DIAGNOSIS — C50111 Malignant neoplasm of central portion of right female breast: Secondary | ICD-10-CM

## 2015-12-16 MED ORDER — RADIAPLEXRX EX GEL
Freq: Once | CUTANEOUS | Status: AC
  Administered 2015-12-16: 13:00:00 via TOPICAL

## 2015-12-17 ENCOUNTER — Encounter: Payer: Self-pay | Admitting: Radiation Oncology

## 2015-12-17 ENCOUNTER — Ambulatory Visit: Payer: BC Managed Care – PPO

## 2015-12-17 ENCOUNTER — Ambulatory Visit
Admission: RE | Admit: 2015-12-17 | Discharge: 2015-12-17 | Disposition: A | Discharge status: Home / Self Care | Payer: BC Managed Care – PPO | Source: Ambulatory Visit | Attending: Radiation Oncology | Admitting: Radiation Oncology

## 2015-12-17 DIAGNOSIS — Z510 Encounter for antineoplastic radiation therapy: Secondary | ICD-10-CM | POA: Diagnosis not present

## 2015-12-18 ENCOUNTER — Telehealth: Payer: Self-pay | Admitting: *Deleted

## 2015-12-18 NOTE — Telephone Encounter (Signed)
  Oncology Nurse Navigator Documentation    Navigator Encounter Type: Telephone (12/18/15 1100) Telephone: Outgoing Call (12/18/15 1100)         Patient Visit Type: [id number] (12/18/15 1100) Treatment Phase: Final Radiation Tx (12/18/15 1100) Barriers/Navigation Needs: No barriers at this time;No Questions;No Needs (12/18/15 1100)   Interventions: Referrals (12/18/15 1100) Referrals: Survivorship (12/18/15 1100)          Acuity: Level 1 (12/18/15 1100)         Time Spent with Patient: 15 (12/18/15 1100)

## 2015-12-22 ENCOUNTER — Other Ambulatory Visit: Payer: Self-pay | Admitting: Adult Health

## 2015-12-22 ENCOUNTER — Telehealth: Payer: Self-pay | Admitting: Hematology and Oncology

## 2015-12-22 DIAGNOSIS — C50111 Malignant neoplasm of central portion of right female breast: Secondary | ICD-10-CM

## 2015-12-22 NOTE — Telephone Encounter (Signed)
appt made and letter sent by mail °

## 2015-12-29 ENCOUNTER — Telehealth: Payer: Self-pay | Admitting: Nurse Practitioner

## 2015-12-29 NOTE — Telephone Encounter (Signed)
PT CALLED TO RESCHED 7/25 APT .Marland Kitchen PT GONE MOST OF SUMMER RESCHEDULED TO 7/13

## 2016-02-03 NOTE — Progress Notes (Signed)
  Radiation Oncology         [phone] ________________________________  Name: BOB CHHAY MRN: [id number]  Date: 12/17/2015  DOB: 04-21-1951  End of Treatment Note  Diagnosis:   Right-sided breast cancer     Indication for treatment:  Curative       Radiation treatment dates:   11/18/2015 through 12/17/2015  Site/dose:   The patient initially received a dose of 42.5 Gy in 17 fractions to the breast using whole-breast tangent fields. This was delivered using a 3-D conformal technique. The patient then received a boost to the seroma. This delivered an additional 7.5 Gy in 3 fractions using a 3 field photon technique due to the depth of the seroma. The total dose was 50 Gy.  Narrative: The patient tolerated radiation treatment relatively well.   The patient had some expected skin irritation as she progressed during treatment. Moist desquamation was not present at the end of treatment.  Plan: The patient has completed radiation treatment. The patient will return to radiation oncology clinic for routine followup in one month. I advised the patient to call or return sooner if they have any questions or concerns related to their recovery or treatment. ________________________________  Jodelle Gross, M.D., Ph.D.

## 2016-02-05 ENCOUNTER — Ambulatory Visit (HOSPITAL_BASED_OUTPATIENT_CLINIC_OR_DEPARTMENT_OTHER): Payer: BC Managed Care – PPO | Admitting: Hematology and Oncology

## 2016-02-05 ENCOUNTER — Encounter: Payer: Self-pay | Admitting: Radiation Oncology

## 2016-02-05 ENCOUNTER — Encounter: Payer: Self-pay | Admitting: Hematology and Oncology

## 2016-02-05 ENCOUNTER — Telehealth: Payer: Self-pay | Admitting: Hematology and Oncology

## 2016-02-05 ENCOUNTER — Ambulatory Visit
Admission: RE | Admit: 2016-02-05 | Discharge: 2016-02-05 | Disposition: A | Discharge status: Home / Self Care | Payer: BC Managed Care – PPO | Source: Ambulatory Visit | Attending: Radiation Oncology | Admitting: Radiation Oncology

## 2016-02-05 VITALS — BP 160/57 | HR 71 | Temp 98.1°F | Resp 18 | Ht 60.0 in | Wt 180.7 lb

## 2016-02-05 VITALS — BP 118/50 | HR 68 | Temp 97.6°F | Ht 60.0 in | Wt 181.5 lb

## 2016-02-05 DIAGNOSIS — C50111 Malignant neoplasm of central portion of right female breast: Secondary | ICD-10-CM | POA: Diagnosis present

## 2016-02-05 DIAGNOSIS — Z170 Estrogen receptor positive status [ER+]: Secondary | ICD-10-CM | POA: Diagnosis not present

## 2016-02-05 DIAGNOSIS — Z79810 Long term (current) use of selective estrogen receptor modulators (SERMs): Secondary | ICD-10-CM

## 2016-02-05 MED ORDER — TAMOXIFEN CITRATE 20 MG PO TABS: 20.0000 mg | ORAL_TABLET | Freq: Every day | ORAL | Status: DC

## 2016-02-05 NOTE — Telephone Encounter (Signed)
appt made and avs printed °

## 2016-02-05 NOTE — Assessment & Plan Note (Signed)
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 Genia Harold) Adjuvant antiestrogen therapy with tamoxifen started 01/08/2016  Tamoxifen toxicities:  Return to clinic in 6 months for follow-up.

## 2016-02-05 NOTE — Progress Notes (Signed)
Ms. Hughett here for reassessment S/P XRT to her right breast.  She denies any pain nor tendernss of her breast, and no fatigue at this time. Hyperpigmentation right breast with intact skin.

## 2016-02-05 NOTE — Progress Notes (Addendum)
Radiation Oncology         [phone] ________________________________  Name: Melody Harding MRN: [id number]  Date: 02/05/2016  DOB: November 30, 1950  Follow-Up Visit Note  CC: Dion Body, MD  Nicholas Lose, MD  Diagnosis:   Intermediate grade DCIS, ER/PR positive.     ICD-9-CM ICD-10-CM   1. 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 This was delivered using a 3-D conformal technique. The patient then received a boost to the seroma. This delivered an additional 7.5 Gy in 3 fractions using a 3 field photon technique due to the depth of the seroma. The total dose was 50 Gy.  Narrative:  The patient returns today for routine follow-up. She reports she is doing well since completing radiotherapy. She denies any concerns with her skin today. She is not experiencing chest pain, shortness of breath. She denies any fevers or chills.                              ALLERGIES:  has No Known Allergies.  Meds: Current Outpatient Prescriptions  Medication Sig Dispense Refill  . acetaminophen (TYLENOL) 500 MG tablet Take 500 mg by mouth as needed. Reported on 11/18/2015    . cetirizine-pseudoephedrine (ZYRTEC-D) 5-120 MG per tablet Take 1 tablet by mouth every morning. Reported on 11/18/2015    . docusate sodium (COLACE) 100 MG capsule Take 100 mg by mouth daily. Takes 2 capsules at bedtime, generic brand    . escitalopram (LEXAPRO) 20 MG tablet Take 20 mg by mouth daily.    Marland Kitchen esomeprazole (NEXIUM) 40 MG capsule Take 40 mg by mouth daily at 12 noon.    . fluticasone (FLONASE) 50 MCG/ACT nasal spray Place into both nostrils daily as needed. Reported on 10/15/2015    . ibuprofen (ADVIL,MOTRIN) 200 MG tablet Take 200 mg by mouth as needed. Reported on 11/18/2015    . montelukast  (SINGULAIR) 10 MG tablet Take 10 mg by mouth at bedtime.    . rosuvastatin (CRESTOR) 20 MG tablet Take 20 mg by mouth daily.    . tamoxifen (NOLVADEX) 20 MG tablet Take 1 tablet (20 mg total) by mouth daily. 90 tablet 3   No current facility-administered medications for this encounter.    Physical Findings:  height is 5' (1.524 m) and weight is 181 lb 8 oz (82.328 kg). Her temperature is 97.6 F (36.4 C). Her blood pressure is 118/50 and her pulse is 68.   In general this is a well appearing Caucasian female in no acute distress. She's alert and oriented x4 and appropriate throughout the examination. Cardiopulmonary assessment is negative for acute distress and she exhibits normal effort. Her right breast is intact without evidence of skin breakdown or desquamation.   Lab Findings: Lab Results  Component Value Date   WBC 6.3 10/15/2015   HGB 13.9 10/15/2015   HCT 42.5 10/15/2015   PLT 265 10/15/2015    Lab Results  Component Value Date   NA 138 10/15/2015   K 4.0 10/15/2015   CHLORIDE 103 10/15/2015   CO2 27 10/15/2015   GLUCOSE 88 10/15/2015   BUN 12.5 10/15/2015   CREATININE 0.9 10/15/2015   BILITOT <0.30 10/15/2015   ALKPHOS 86 10/15/2015  AST 13 10/15/2015   ALT 12 10/15/2015   PROT 6.8 10/15/2015   ALBUMIN 3.8 10/15/2015   CALCIUM 9.3 10/15/2015   ANIONGAP 9 10/15/2015    Radiographic Findings: No results found.  Impression/Plan: 1. Intermediate grade DCIS, ER/PR positive. The patient is doing well overall since her radiotherapy. She will begin tamoxifen today and will continue her follow up with Dr. Lindi Adie and survivorship clinic. She will return to clinic as needed if she has questions or concerns about her previous therapy.     Carola Rhine, PAC

## 2016-02-05 NOTE — Progress Notes (Signed)
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 Right breast central to nipple with new grouped calcs. Breast US-right breast calcs suspicious for malignancy.    10/06/2015 Initial Biopsy Right breast needle core biopsy: DCIS with comedonecrosis, high grade. ER 90%, PR 90%.    10/22/2015 Surgery (R) Lumpectomy Donne Hazel): DCIS with calcs, intermediate grade, spanning 0.9 cm; Margins negative. ER/PR (+). pTis, pNx: Stage 0   11/18/2015 - 12/17/2015 Radiation Therapy  Right breast 42.5 Gy in 17 fractions.  Right breast boost 7.5 Gy in 3 fractions. Lisbeth Renshaw)   02/05/2016 -  Anti-estrogen oral therapy Tamoxifen 20 mg daily. 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 She had recovered very well from effects of radiation. [place]  REVIEW OF SYSTEMS:   Constitutional: Denies fevers, chills or abnormal weight loss Eyes: Denies blurriness of vision Ears, nose, mouth, throat, and face: Denies mucositis or sore  throat Respiratory: Denies cough, dyspnea or wheezes Cardiovascular: Denies palpitation, chest discomfort Gastrointestinal:  Denies nausea, heartburn or change in bowel habits Skin: Denies abnormal skin rashes Lymphatics: Denies new lymphadenopathy or easy bruising Neurological:Denies numbness, tingling or new weaknesses Behavioral/Psych: Mood is stable, no new changes  Extremities: No lower extremity edema Breast:  denies any pain or lumps or nodules in either breasts All other systems were reviewed with the patient and are negative.  I have reviewed the past medical history, past surgical history, social history and family history with the patient and they are unchanged from previous note.  ALLERGIES:  has No Known Allergies.  MEDICATIONS:  Current Outpatient Prescriptions  Medication Sig Dispense Refill  . acetaminophen (TYLENOL) 500 MG tablet Take 500 mg by mouth as needed. Reported on 11/18/2015    . cetirizine-pseudoephedrine (ZYRTEC-D) 5-120 MG per tablet Take 1 tablet by mouth every morning. Reported on 11/18/2015    . docusate sodium (COLACE) 100 MG capsule Take 100 mg by mouth daily. Takes 2 capsules at bedtime, generic brand    . escitalopram (LEXAPRO) 20 MG tablet Take 20 mg by mouth daily.    Marland Kitchen esomeprazole (NEXIUM) 40 MG capsule Take 40 mg by mouth daily at 12 noon.    . fluticasone (FLONASE) 50 MCG/ACT nasal spray Place into both nostrils daily as needed. Reported on 10/15/2015    . ibuprofen (ADVIL,MOTRIN) 200 MG tablet Take 200 mg by mouth as needed. Reported on 11/18/2015    . montelukast (SINGULAIR) 10 MG tablet Take 10 mg by mouth at bedtime.    . non-metallic deodorant [name]) MISC Apply 1 application topically daily as needed.    Marland Kitchen  rosuvastatin (CRESTOR) 20 MG tablet Take 20 mg by mouth daily.    . tamoxifen (NOLVADEX) 20 MG tablet Take 1 tablet (20 mg total) by mouth daily. 90 tablet 3   No current facility-administered medications for this visit.    PHYSICAL  EXAMINATION: ECOG PERFORMANCE STATUS: 0 - Asymptomatic  Filed Vitals:   02/05/16 0926  BP: 160/57  Pulse: 71  Temp: 98.1 F (36.7 C)  Resp: 18   Filed Weights   02/05/16 0926  Weight: 180 lb 11.2 oz (81.965 kg)    GENERAL:alert, no distress and comfortable SKIN: skin color, texture, turgor are normal, no rashes or significant lesions EYES: normal, Conjunctiva are pink and non-injected, sclera clear OROPHARYNX:no exudate, no erythema and lips, buccal mucosa, and tongue normal  NECK: supple, thyroid normal size, non-tender, without nodularity LYMPH:  no palpable lymphadenopathy in the cervical, axillary or inguinal LUNGS: clear to auscultation and percussion with normal breathing effort HEART: regular rate & rhythm and no murmurs and no lower extremity edema ABDOMEN:abdomen soft, non-tender and normal bowel sounds MUSCULOSKELETAL:no cyanosis of digits and no clubbing  NEURO: alert & oriented x 3 with fluent speech, no focal motor/sensory deficits EXTREMITIES: No lower extremity edema  LABORATORY DATA:  I have reviewed the data as listed   Chemistry      Component Value Date/Time   NA 138 10/15/2015 1220   K 4.0 10/15/2015 1220   CO2 27 10/15/2015 1220   BUN 12.5 10/15/2015 1220   CREATININE 0.9 10/15/2015 1220      Component Value Date/Time   CALCIUM 9.3 10/15/2015 1220   ALKPHOS 86 10/15/2015 1220   AST 13 10/15/2015 1220   ALT 12 10/15/2015 1220   BILITOT <0.30 10/15/2015 1220       Lab Results  Component Value Date   WBC 6.3 10/15/2015   HGB 13.9 10/15/2015   HCT 42.5 10/15/2015   MCV 88.1 10/15/2015   PLT 265 10/15/2015   NEUTROABS 3.3 10/15/2015     ASSESSMENT & PLAN:  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 Genia Harold) Adjuvant antiestrogen therapy with tamoxifen started 02/05/2016  Tamoxifen toxicities: I discussed the pros and cons of adjuvant tamoxifen therapy Prognosis of DCIS: According to the DCIS nomogram, her 10 year risk of recurrence after tamoxifen therapy would be at 50%. With that tamoxifen would be at 7%.  Return to clinic in 3 months for follow-up. I [facility] the prescription [facility].  No orders of the defined types were placed in this encounter.   The patient has a good understanding of the overall plan. she agrees with it. she will call with any problems that may develop before the next visit here.   Rulon Eisenmenger, MD 02/05/2016

## 2016-02-19 ENCOUNTER — Encounter: Payer: BC Managed Care – PPO | Admitting: Nurse Practitioner

## 2016-02-25 NOTE — Progress Notes (Signed)
CLINIC:  Survivorship   REASON FOR VISIT:  Routine follow-up post-treatment for a recent history of breast cancer.  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 Right breast central to nipple with new grouped calcs. Breast US-right breast calcs suspicious for malignancy.    10/06/2015 Initial Biopsy Right breast needle core biopsy: DCIS with comedonecrosis, high grade. ER 90%, PR 90%.    10/22/2015 Surgery (R) Lumpectomy Donne Hazel): DCIS with calcs, intermediate grade, spanning 0.9 cm; Margins negative. ER/PR (+). pTis, pNx: Stage 0   11/18/2015 - 12/17/2015 Radiation Therapy  Right breast 42.5 Gy in 17 fractions.  Right breast boost 7.5 Gy in 3 fractions. Lisbeth Renshaw)   02/05/2016 -  Anti-estrogen oral therapy Tamoxifen 20 mg daily. Planned duration of treatment: 5 years    INTERVAL HISTORY:  Melody Harding presents to the [facility] today for our initial meeting to review her survivorship care plan detailing her treatment course for breast cancer, as well as monitoring long-term side effects of that treatment, education regarding health maintenance, screening, and overall wellness and health promotion.     Overall, Melody Harding reports feeling quite well since completing her radiation therapy approximately 2 months ago.  She is having hot flashes related to the Tamoxifen.  She has some fatigue; she does not participate in any formal exercise.  She has been having some decreased appetite for the past couple of weeks, which she relates to it being hot outside and "nothing really sounds good to eat."  Otherwise, she is largely without complaints.   REVIEW OF SYSTEMS:  Review of Systems  Constitutional: Positive for malaise/fatigue. Negative for fever.       (+) fatigue  Gastrointestinal: Negative for nausea, vomiting, abdominal pain, diarrhea and constipation.  Genitourinary: Negative for dysuria.  Skin: Negative  for rash.  Psychiatric/Behavioral: Negative for depression. The patient is not nervous/anxious.   GU: Denies vaginal bleeding, discharge, or dryness.  Breast: Denies any new nodularity, masses, tenderness, nipple changes, or nipple discharge.    A 14-point review of systems was completed and was negative, except as noted above.   ONCOLOGY TREATMENT TEAM:  1. Surgeon:  Dr. [facility]el at [facility] 2. Medical Oncologist: Dr. Lindi Adie 3. Radiation Oncologist: Dr. Lisbeth Renshaw    PAST MEDICAL/SURGICAL HISTORY:  Past Medical History  Diagnosis Date  . Seasonal allergies   . Depression   . GERD (gastroesophageal reflux disease)   . Wears glasses   . Wears hearing aid     right  . Hyperlipemia   . 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 Breast cancer ([place])   . H/O colonoscopy 2012  . H/O bone density study 1993   Past Surgical History  Procedure Laterality Date  . Tonsillectomy    . Tubal ligation    . Abdominal hysterectomy      part  . Wisdom tooth extraction    . Colonoscopy    . Breast ductal system excision Left 06/20/2014    Procedure: LEFT BREAST CENTRAL DUCT EXCISION;  Surgeon: Erroll Luna, MD;  Location: [place];  Service: General;  Laterality: Left;  . Breast lumpectomy with radioactive seed localization Right 10/22/2015    Procedure: BREAST LUMPECTOMY WITH RADIOACTIVE SEED LOCALIZATION;  Surgeon: Rolm Bookbinder, MD;  Location: [place];  Service: General;  Laterality: Right;  . Foot surgery    . Hand surgery  ALLERGIES:  No Known Allergies   CURRENT MEDICATIONS:  Outpatient Encounter Prescriptions as of 02/26/2016  Medication Sig  . acetaminophen (TYLENOL) 500 MG tablet Take 500 mg by mouth as needed. Reported on 11/18/2015  . cetirizine-pseudoephedrine (ZYRTEC-D) 5-120 MG per tablet Take 1 tablet by mouth every morning. Reported on 11/18/2015  . docusate sodium (COLACE) 100 MG capsule Take  100 mg by mouth daily. Takes 2 capsules at bedtime, generic brand  . escitalopram (LEXAPRO) 20 MG tablet Take 20 mg by mouth daily.  Marland Kitchen esomeprazole (NEXIUM) 40 MG capsule Take 40 mg by mouth daily at 12 noon.  . fluticasone (FLONASE) 50 MCG/ACT nasal spray Place into both nostrils daily as needed. Reported on 10/15/2015  . ibuprofen (ADVIL,MOTRIN) 200 MG tablet Take 200 mg by mouth as needed. Reported on 11/18/2015  . montelukast (SINGULAIR) 10 MG tablet Take 10 mg by mouth at bedtime.  . rosuvastatin (CRESTOR) 20 MG tablet Take 20 mg by mouth daily.  . tamoxifen (NOLVADEX) 20 MG tablet Take 1 tablet (20 mg total) by mouth daily.   No facility-administered encounter medications on file as of 02/26/2016.     ONCOLOGIC FAMILY HISTORY:  Family History  Problem Relation Age of Onset  . Pancreatic cancer Mother 73  . Liver cancer Mother   . Breast cancer Maternal Aunt 72  . Breast cancer Paternal Aunt 11     GENETIC COUNSELING/TESTING: None.  SOCIAL HISTORY:  Melody Harding is married and lives with her husband in [place], [place].  She is currently retired; previously worked as an Automotive engineer in [place].  She denies any current or history of tobacco or illicit drug use.  She drinks alcohol occasionally.   PHYSICAL EXAMINATION:  Vital Signs:   Filed Vitals:   02/26/16 0911  BP: 130/70  Pulse: 80  Temp: 97.1 F (36.2 C)  Resp: 19   Filed Weights   02/26/16 0911  Weight: 180 lb 4.8 oz (81.784 kg)   General: Well-nourished, well-appearing female in no acute distress.  She is unaccompanied today.   HEENT: Head is normocephalic.  Pupils equal and reactive to light and accomodation. Conjunctivae clear without exudate.  Sclerae anicteric. Oral mucosa is pink, moist.  Oropharynx is pink without lesions or erythema.  Lymph: No cervical, supraclavicular, or infraclavicular lymphadenopathy noted on palpation.  Cardiovascular: Regular rate and rhythm.Marland Kitchen Respiratory:  Clear to auscultation bilaterally. Chest expansion symmetric; breathing non-labored.  GI: Abdomen soft and round; non-tender, non-distended. Bowel sounds normoactive. GU: Deferred.  Neuro: No focal deficits. Steady gait.  Psych: Mood and affect normal and appropriate for situation.  Extremities: No edema. Skin: Warm and dry.  LABORATORY DATA:  None for this visit.  DIAGNOSTIC IMAGING:  None for this visit.      ASSESSMENT AND PLAN:  Ms.. Harding is a pleasant 65 y.o. female with Stage 0, right breast DCIS, ER+/PR+ diagnosed in 09/2015, treated with lumpectomy, adjuvant radiation therapy, and anti-estrogen therapy with tamoxifen beginning in 01/2016.  She presents to the Survivorship Clinic for our initial meeting and routine follow-up post-completion of treatment for breast cancer.    1. Stage 0 right breast DCIS:  Melody Harding is continuing to recover from the effects of definitive treatment for right breast cancer.  She will follow-up with her medical oncologist, Dr. Lindi Adie in 05/2016 with history and physical exam per surveillance protocol.  She will continue her anti-estrogen therapy with Tamoxifen as prescribed by Dr. Lindi Adie. She is having hot flashes, but otherwise is  tolerating the Tamoxifen well thus far.  She was instructed to make Dr. Lindi Adie or myself aware if she begins to experience any worsening side effects of the medication and I could see her back in clinic to help manage those side effects, as needed. Melody Harding has had a hysterectomy, so her risk for tamoxifen-associated endometrial cancer is unlikely. Other side effects of Tamoxifen were again reviewed with her as well. A comprehensive survivorship care plan and treatment summary was reviewed with the patient today detailing her breast cancer diagnosis, treatment course, potential late/long-term effects of treatment, appropriate follow-up care with recommendations for the future, and patient education resources.  A copy of this  summary, along with a letter will be sent to the patient's primary care provider via mail/fax/In Basket message after today's visit.    2. Fatigue: This is likely multifactorial given her recent cancer treatments and subsequent recovery.  We discussed that exercise is the best way to combat treatment-related fatigue. We discussed the [employer] fitness program, which is designed for cancer survivors to help them become more physically fit after cancer treatments. [employer] would be a wonderful avenue for her to begin a more physically active lifestyle now that she has completed treatment.  She will consider this and was given information on who to contact to get enrolled if she chooses.  She wants to check to see if the [facility] offers the [place] program since that would be more convenient for her to participate closer to home.     3. Hot flashes secondary to tamoxifen: Hot flashes are an extremely common side effect of anti-estrogen therapies, like tamoxifen.  Right now, her hot flashes are not severe and she is not interested in any additional interventions at this time.  Dr. Lindi Adie will continue to see her and monitor her tolerance to the medication; she was encouraged to call me with any questions or concerns as well.    4. Bone health:  Given Melody Harding's age and history of breast cancer, she is at risk for bone demineralization.  We do not have records of a DEXA scan, but we discussed that the tamoxifen may offer bone strengthening benefits for her.  However, given that she is post-menopausal, she was still encouraged to increase her consumption of foods rich in calcium, as well as increase her weight-bearing activities.  She was given education on specific activities to promote bone health.  5. Cancer screening:  Due to Melody Harding history and her age, she should receive screening for skin cancers, colon cancer, and gynecologic cancers.  The information and recommendations are listed  on the patient's comprehensive care plan/treatment summary and were reviewed in detail with the patient.    6. Health maintenance and wellness promotion: Melody Harding was encouraged to consume 5-7 servings of fruits and vegetables per day. We reviewed the "Nutrition Rainbow" handout, as well as the handout about "Nutrition for Breast Cancer Survivors."  She was also encouraged to engage in moderate to vigorous exercise for 30 minutes per day most days of the week.  She was instructed to limit her alcohol consumption and continue to abstain from tobacco use.   7. Support services/counseling: It is not uncommon for this period of the patient's cancer care trajectory to be one of many emotions and stressors.  We discussed an opportunity for her to participate in the next session of [facility] ("Finding Your New Normal") support group series designed for patients after they have completed treatment.   Ms.  Harding was encouraged to take advantage of our many other support services programs, support groups, and/or counseling in coping with her new life as a cancer survivor after completing anti-cancer treatment.  She was offered support today through active listening and expressive supportive counseling.  She was given information regarding our available services and encouraged to contact me with any questions or for help enrolling in any of our support group/programs.    Dispo:   -Return to cancer center to see Dr. Lindi Adie in 05/2016.  -She is welcome to return back to the Survivorship Clinic at any time; no additional follow-up needed at this time.  -Consider referral back to survivorship as a long-term survivor for continued surveillance  A total of 35 minutes of face-to-face time was spent with this patient with greater than 50% of that time in counseling and care-coordination.   Mike Craze, NP Survivorship Program [facility] [phone]   Note: PRIMARY CARE PROVIDER Dion Body, [place] [facility] [phone]

## 2016-02-26 ENCOUNTER — Ambulatory Visit (HOSPITAL_BASED_OUTPATIENT_CLINIC_OR_DEPARTMENT_OTHER): Payer: BC Managed Care – PPO | Admitting: Adult Health

## 2016-02-26 VITALS — BP 130/70 | HR 80 | Temp 97.1°F | Resp 19 | Wt 180.3 lb

## 2016-02-26 DIAGNOSIS — D0511 Intraductal carcinoma in situ of right breast: Secondary | ICD-10-CM | POA: Diagnosis not present

## 2016-02-26 DIAGNOSIS — Z79810 Long term (current) use of selective estrogen receptor modulators (SERMs): Secondary | ICD-10-CM | POA: Diagnosis not present

## 2016-02-26 DIAGNOSIS — Z170 Estrogen receptor positive status [ER+]: Secondary | ICD-10-CM | POA: Diagnosis not present

## 2016-02-26 DIAGNOSIS — C50111 Malignant neoplasm of central portion of right female breast: Secondary | ICD-10-CM

## 2016-02-26 NOTE — Patient Instructions (Signed)
Thank you for coming in to see me today. I enjoyed meeting you!   Feel free to call me with any questions or concerns!   Mike Craze, NP [place] [phone]

## 2016-03-02 ENCOUNTER — Encounter: Payer: BC Managed Care – PPO | Admitting: Nurse Practitioner

## 2016-03-26 NOTE — Addendum Note (Signed)
Encounter addended by: Benn Moulder, RN on: 03/26/2016  8:29 PM<BR>    Actions taken: Charge Capture section accepted

## 2016-04-05 DIAGNOSIS — Z853 Personal history of malignant neoplasm of breast: Secondary | ICD-10-CM | POA: Insufficient documentation

## 2016-05-07 ENCOUNTER — Ambulatory Visit: Payer: BC Managed Care – PPO | Admitting: Hematology and Oncology

## 2016-05-10 ENCOUNTER — Ambulatory Visit (HOSPITAL_BASED_OUTPATIENT_CLINIC_OR_DEPARTMENT_OTHER): Payer: BC Managed Care – PPO | Admitting: Hematology and Oncology

## 2016-05-10 ENCOUNTER — Encounter: Payer: Self-pay | Admitting: Hematology and Oncology

## 2016-05-10 DIAGNOSIS — D0511 Intraductal carcinoma in situ of right breast: Secondary | ICD-10-CM

## 2016-05-10 DIAGNOSIS — Z170 Estrogen receptor positive status [ER+]: Secondary | ICD-10-CM | POA: Diagnosis not present

## 2016-05-10 DIAGNOSIS — Z79810 Long term (current) use of selective estrogen receptor modulators (SERMs): Secondary | ICD-10-CM | POA: Diagnosis not present

## 2016-05-10 DIAGNOSIS — C50411 Malignant neoplasm of upper-outer quadrant of right female breast: Secondary | ICD-10-CM

## 2016-05-10 NOTE — Progress Notes (Signed)
Patient Care Team: Dion Body, MD as PCP - General (Family Medicine) Rolm Bookbinder, MD as Consulting Physician (General Surgery) Nicholas Lose, MD as Consulting Physician (Hematology and Oncology) Kyung Rudd, MD as Consulting Physician (Radiation Oncology) Sylvan Cheese, NP as Nurse Practitioner (Hematology and Oncology)  DIAGNOSIS: Breast cancer of upper-outer quadrant of right female breast [facility])   Staging form: Breast, AJCC 7th Edition   - Clinical stage from 10/15/2015: Stage 0 (Tis (DCIS), N0, M0) - Unsigned         Staging comments: Staged at breast conference 3.8.17  SUMMARY OF ONCOLOGIC HISTORY:   Breast cancer of upper-outer quadrant of right female breast ([place])   10/03/2015 Mammogram    Screening mammo ([facility]). Right breast central to nipple with new grouped calcs. Breast US-right breast calcs suspicious for malignancy.       10/06/2015 Initial Biopsy    Right breast needle core biopsy: DCIS with comedonecrosis, high grade. ER 90%, PR 90%.       10/22/2015 Surgery    (R) Lumpectomy Melody Harding): DCIS with calcs, intermediate grade, spanning 0.9 cm; Margins negative. ER/PR (+). pTis, pNx: Stage 0      11/18/2015 - 12/17/2015 Radiation Therapy     Right breast 42.5 Gy in 17 fractions.  Right breast boost 7.5 Gy in 3 fractions. Lisbeth Renshaw)      02/05/2016 -  Anti-estrogen oral therapy    Tamoxifen 20 mg daily. Planned duration of treatment: 5 years       CHIEF COMPLIANT: Follow-up on tamoxifen therapy  INTERVAL HISTORY: Melody Harding is a 65 year old with above-mentioned history right breast cancer currently on adjuvant tamoxifen. She is tolerating it extremely well. She does report occasional hot flashes. Denies any myalgias. Denies any lumps or nodules in the breast.  REVIEW OF SYSTEMS:   Constitutional: Denies fevers, chills or abnormal weight loss Eyes: Denies blurriness of vision Ears, nose, mouth, throat, and face: Denies mucositis or sore  throat Respiratory: Denies cough, dyspnea or wheezes Cardiovascular: Denies palpitation, chest discomfort Gastrointestinal:  Denies nausea, heartburn or change in bowel habits Skin: Denies abnormal skin rashes Lymphatics: Denies new lymphadenopathy or easy bruising Neurological:Denies numbness, tingling or new weaknesses Behavioral/Psych: Mood is stable, no new changes  Extremities: No lower extremity edema Breast:  denies any pain or lumps or nodules in either breasts All other systems were reviewed with the patient and are negative.  I have reviewed the past medical history, past surgical history, social history and family history with the patient and they are unchanged from previous note.  ALLERGIES:  has No Known Allergies.  MEDICATIONS:  Current Outpatient Prescriptions  Medication Sig Dispense Refill  . acetaminophen (TYLENOL) 500 MG tablet Take 500 mg by mouth as needed. Reported on 11/18/2015    . cetirizine-pseudoephedrine (ZYRTEC-D) 5-120 MG per tablet Take 1 tablet by mouth every morning. Reported on 02/26/2016    . docusate sodium (COLACE) 100 MG capsule Take 100 mg by mouth daily. Takes 2 capsules at bedtime, generic brand    . escitalopram (LEXAPRO) 20 MG tablet Take 20 mg by mouth daily.    Marland Kitchen esomeprazole (NEXIUM) 40 MG capsule Take 40 mg by mouth daily at 12 noon.    . fluticasone (FLONASE) 50 MCG/ACT nasal spray Place into both nostrils daily as needed. Reported on 10/15/2015    . ibuprofen (ADVIL,MOTRIN) 200 MG tablet Take 200 mg by mouth as needed. Reported on 02/26/2016    . montelukast (SINGULAIR) 10 MG tablet Take 10 mg  by mouth at bedtime.    . rosuvastatin (CRESTOR) 20 MG tablet Take 20 mg by mouth daily.    . tamoxifen (NOLVADEX) 20 MG tablet Take 1 tablet (20 mg total) by mouth daily. 90 tablet 3   No current facility-administered medications for this visit.     PHYSICAL EXAMINATION: ECOG PERFORMANCE STATUS: 0 - Asymptomatic  There were no vitals filed for  this visit. There were no vitals filed for this visit.  GENERAL:alert, no distress and comfortable SKIN: skin color, texture, turgor are normal, no rashes or significant lesions EYES: normal, Conjunctiva are pink and non-injected, sclera clear OROPHARYNX:no exudate, no erythema and lips, buccal mucosa, and tongue normal  NECK: supple, thyroid normal size, non-tender, without nodularity LYMPH:  no palpable lymphadenopathy in the cervical, axillary or inguinal LUNGS: clear to auscultation and percussion with normal breathing effort HEART: regular rate & rhythm and no murmurs and no lower extremity edema ABDOMEN:abdomen soft, non-tender and normal bowel sounds MUSCULOSKELETAL:no cyanosis of digits and no clubbing  NEURO: alert & oriented x 3 with fluent speech, no focal motor/sensory deficits EXTREMITIES: No lower extremity edema BREAST: No palpable masses or nodules in either right or left breasts. No palpable axillary supraclavicular or infraclavicular adenopathy no breast tenderness or nipple discharge. (exam performed in the presence of a chaperone)  LABORATORY DATA:  I have reviewed the data as listed   Chemistry      Component Value Date/Time   NA 138 10/15/2015 1220   K 4.0 10/15/2015 1220   CO2 27 10/15/2015 1220   BUN 12.5 10/15/2015 1220   CREATININE 0.9 10/15/2015 1220      Component Value Date/Time   CALCIUM 9.3 10/15/2015 1220   ALKPHOS 86 10/15/2015 1220   AST 13 10/15/2015 1220   ALT 12 10/15/2015 1220   BILITOT <0.30 10/15/2015 1220       Lab Results  Component Value Date   WBC 6.3 10/15/2015   HGB 13.9 10/15/2015   HCT 42.5 10/15/2015   MCV 88.1 10/15/2015   PLT 265 10/15/2015   NEUTROABS 3.3 10/15/2015     ASSESSMENT & PLAN:  Breast cancer of upper-outer quadrant of right female breast ([place]) Right breast calcifications on screening mammogram 10/06/2015:9 mm span, intermediate grade DCIS with central necrosis and calcifications ER PR positive Rt  Lumpectomy 10/20/15: DCIS with calcs, Margins Neg, Er 90%, PR 90%, TisN0 (Stage 0) Adjuvant radiation therapy: 11/18/2015- 12/17/2015 (Dr. Genia Harold) Adjuvant antiestrogen therapy with tamoxifen started 02/05/2016 Prognosis of DCIS: According to the DCIS nomogram, her 10 year risk of recurrence after tamoxifen therapy would be at 50%. With that tamoxifen would be at 7%.  Tamoxifen toxicities: 1. Hot flashes 3-4 per day especially in the evening hours. There do seem to be getting better with time. Denies any myalgias.  Survivorship: I discussed with her the importance of physical exercise and provided her information on live strong program. I also discussed the importance of foods and vegetables and decreasing red meat in her diet.  Return to clinic in 6 months for follow-up   No orders of the defined types were placed in this encounter.  The patient has a good understanding of the overall plan. she agrees with it. she will call with any problems that may develop before the next visit here.   Rulon Eisenmenger, MD 05/10/16

## 2016-05-10 NOTE — Assessment & Plan Note (Signed)
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 Genia Harold) Adjuvant antiestrogen therapy with tamoxifen started 02/05/2016 Prognosis of DCIS: According to the DCIS nomogram, her 10 year risk of recurrence after tamoxifen therapy would be at 50%. With that tamoxifen would be at 7%.  Tamoxifen toxicities:  Return to clinic in 6 months for follow-up

## 2016-06-29 ENCOUNTER — Emergency Department
Admission: EM | Admit: 2016-06-29 | Discharge: 2016-06-29 | Disposition: A | Discharge status: Home / Self Care | Payer: Medicare Other | Attending: Emergency Medicine | Admitting: Emergency Medicine

## 2016-06-29 ENCOUNTER — Encounter: Payer: Self-pay | Admitting: Emergency Medicine

## 2016-06-29 DIAGNOSIS — R1084 Generalized abdominal pain: Secondary | ICD-10-CM | POA: Insufficient documentation

## 2016-06-29 DIAGNOSIS — R197 Diarrhea, unspecified: Secondary | ICD-10-CM

## 2016-06-29 DIAGNOSIS — Z79899 Other long term (current) drug therapy: Secondary | ICD-10-CM | POA: Insufficient documentation

## 2016-06-29 DIAGNOSIS — Z853 Personal history of malignant neoplasm of breast: Secondary | ICD-10-CM | POA: Diagnosis not present

## 2016-06-29 LAB — COMPREHENSIVE METABOLIC PANEL
ALT: 16 U/L (ref 14–54)
AST: 15 U/L (ref 15–41)
Albumin: 4.1 g/dL (ref 3.5–5.0)
Alkaline Phosphatase: 76 U/L (ref 38–126)
Anion gap: 12 (ref 5–15)
BUN: 13 mg/dL (ref 6–20)
CHLORIDE: 105 mmol/L (ref 101–111)
CO2: 21 mmol/L — AB (ref 22–32)
CREATININE: 0.94 mg/dL (ref 0.44–1.00)
Calcium: 9.3 mg/dL (ref 8.9–10.3)
GFR calc non Af Amer: >60 mL/min (ref >60)
Glucose, Bld: 122 mg/dL — ABNORMAL HIGH (ref 65–99)
POTASSIUM: 3.3 mmol/L — AB (ref 3.5–5.1)
SODIUM: 138 mmol/L (ref 135–145)
Total Bilirubin: 0.8 mg/dL (ref 0.3–1.2)
Total Protein: 7.1 g/dL (ref 6.5–8.1)

## 2016-06-29 LAB — CBC
HEMATOCRIT: 46 % (ref 35.0–47.0)
HEMOGLOBIN: 16.2 g/dL — AB (ref 12.0–16.0)
MCH: 31.4 pg (ref 26.0–34.0)
MCHC: 35.1 g/dL (ref 32.0–36.0)
MCV: 89.5 fL (ref 80.0–100.0)
PLATELETS: 223 10*3/uL (ref 150–440)
RBC: 5.14 MIL/uL (ref 3.80–5.20)
RDW: 13.1 % (ref 11.5–14.5)
WBC: 8.4 10*3/uL (ref 3.6–11.0)

## 2016-06-29 LAB — URINALYSIS COMPLETE WITH MICROSCOPIC (ARMC ONLY)
Bilirubin Urine: NEGATIVE
Glucose, UA: NEGATIVE mg/dL
LEUKOCYTES UA: NEGATIVE
Nitrite: NEGATIVE
PH: 5 (ref 5.0–8.0)
PROTEIN: NEGATIVE mg/dL
Specific Gravity, Urine: 1.014 (ref 1.005–1.030)

## 2016-06-29 LAB — LIPASE, BLOOD: LIPASE: 22 U/L (ref 11–51)

## 2016-06-29 MED ORDER — ONDANSETRON HCL 4 MG/2ML IJ SOLN
4.0000 mg | Freq: Once | INTRAMUSCULAR | Status: AC | PRN
  Administered 2016-06-29: 4 mg via INTRAVENOUS
  Filled 2016-06-29: qty 2

## 2016-06-29 MED ORDER — SODIUM CHLORIDE 0.9 % IV SOLN
Freq: Once | INTRAVENOUS | Status: AC
  Administered 2016-06-29: 20:00:00 via INTRAVENOUS

## 2016-06-29 MED ORDER — SODIUM CHLORIDE 0.9 % IV SOLN
Freq: Once | INTRAVENOUS | Status: AC
  Administered 2016-06-29: 18:00:00 via INTRAVENOUS

## 2016-06-29 NOTE — ED Notes (Signed)
Patient made aware stool sample is needed. Unable to give sample at this time.

## 2016-06-29 NOTE — ED Triage Notes (Signed)
Patient presents to the ED with diarrhea this morning.  Patient reports 7 episodes today.  Patient states, "I've been having diarrhea for about a month if I don't stay on a really bland diet.  I had a colonoscopy last week and they removed a polyp."  Patient reports epigastric pain.  Patient denies vomiting.  Patient is in no obvious distress at this time.

## 2016-06-29 NOTE — Discharge Instructions (Signed)
Please follow-up with your doctor. Please return if you're worse get lightheaded fever or the diarrhea returns very frequently. We will try to get shoe the equipment to bring back a stool specimen for [place].

## 2016-06-29 NOTE — ED Provider Notes (Signed)
[facility] Emergency Department Provider Note   ____________________________________________   First MD Initiated Contact with Patient 06/29/16 1748     (approximate)  I have reviewed the triage vital signs and the nursing notes.   HISTORY  Chief Complaint Diarrhea   HPI Melody Harding is a 65 y.o. female who reports she's been having intermittent diarrhea for about a month. She's not had any blood in the diarrhea not had a fever she gets occasional crampy bed pains and then has the diarrhea. She had 7 episodes of diarrhea today. She's not had any fever today. She denies any raw food camping stream water and just generally like that. Patient thinks she's lost about 6 pounds of weight. She thinks that may be due to stopping her one glass of wine a day.   Past Medical History:  Diagnosis Date  . Breast cancer ([place])   . 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 Depression   . GERD (gastroesophageal reflux disease)   . H/O bone density study 1993  . H/O colonoscopy 2012  . Hyperlipemia   . Seasonal allergies   . Wears glasses   . Wears hearing aid    right    Patient Active Problem List   Diagnosis Date Noted  . Breast cancer of upper-outer quadrant of right female breast ([place]) 10/10/2015    Past Surgical History:  Procedure Laterality Date  . ABDOMINAL HYSTERECTOMY     part  . BREAST DUCTAL SYSTEM EXCISION Left 06/20/2014   Procedure: LEFT BREAST CENTRAL DUCT EXCISION;  Surgeon: Erroll Luna, MD;  Location: [place];  Service: General;  Laterality: Left;  . BREAST LUMPECTOMY WITH RADIOACTIVE SEED LOCALIZATION Right 10/22/2015   Procedure: BREAST LUMPECTOMY WITH RADIOACTIVE SEED LOCALIZATION;  Surgeon: Rolm Bookbinder, MD;  Location: [place];  Service: General;  Laterality: Right;  . COLONOSCOPY    . FOOT SURGERY    . HAND SURGERY    . TONSILLECTOMY    . TUBAL LIGATION    . WISDOM  TOOTH EXTRACTION      Prior to Admission medications   Medication Sig Start Date End Date Taking? Authorizing Provider  acetaminophen (TYLENOL) 500 MG tablet Take 500 mg by mouth as needed. Reported on 11/18/2015    Historical Provider, MD  cetirizine-pseudoephedrine (ZYRTEC-D) 5-120 MG per tablet Take 1 tablet by mouth every morning. Reported on 02/26/2016    Historical Provider, MD  docusate sodium (COLACE) 100 MG capsule Take 100 mg by mouth daily. Takes 2 capsules at bedtime, generic brand    Historical Provider, MD  escitalopram (LEXAPRO) 20 MG tablet Take 20 mg by mouth daily.    Historical Provider, MD  esomeprazole (NEXIUM) 40 MG capsule Take 40 mg by mouth daily at 12 noon.    Historical Provider, MD  fluticasone (FLONASE) 50 MCG/ACT nasal spray Place into both nostrils daily as needed. Reported on 10/15/2015    Historical Provider, MD  ibuprofen (ADVIL,MOTRIN) 200 MG tablet Take 200 mg by mouth as needed. Reported on 02/26/2016    Historical Provider, MD  montelukast (SINGULAIR) 10 MG tablet Take 10 mg by mouth at bedtime.    Historical Provider, MD  rosuvastatin (CRESTOR) 20 MG tablet Take 20 mg by mouth daily.    Historical Provider, MD  tamoxifen (NOLVADEX) 20 MG tablet Take 1 tablet (20 mg total) by mouth daily. 02/05/16   Nicholas Lose, MD    Allergies Patient has no known  allergies.  Family History  Problem Relation Age of Onset  . Pancreatic cancer Mother 26  . Liver cancer Mother   . Breast cancer Maternal Aunt 72  . Breast cancer Paternal Aunt 8    Social History Social History  Substance Use Topics  . Smoking status: Never Smoker  . Smokeless tobacco: Never Used  . Alcohol use 3.0 oz/week    5 Standard drinks or equivalent per week     Comment: occ    Review of Systems Constitutional: No fever/chills Eyes: No visual changes. ENT: No sore throat. Cardiovascular: Denies chest pain. Respiratory: Denies shortness of breath. Gastrointestinal: See history of  present illness Genitourinary: Negative for dysuria. Musculoskeletal: Negative for back pain. Skin: Negative for rash. Neurological: Negative for headaches, focal weakness or numbness.  10-point ROS otherwise negative.  ____________________________________________   PHYSICAL EXAM:  VITAL SIGNS: ED Triage Vitals  Enc Vitals Group     BP 06/29/16 1645 (!) 144/80     Pulse Rate 06/29/16 1645 79     Resp 06/29/16 1645 18     Temp 06/29/16 1645 98.2 F (36.8 C)     Temp Source 06/29/16 1645 Oral     SpO2 06/29/16 1645 98 %     Weight 06/29/16 1646 174 lb (78.9 kg)     Height 06/29/16 1646 5' (1.524 m)     Head Circumference --      Peak Flow --      Pain Score 06/29/16 1646 5     Pain Loc --      Pain Edu? --      Excl. in [place]? --     Constitutional: Alert and oriented. Well appearing and in no acute distress. Eyes: Conjunctivae are normal. PERRL. EOMI. Head: Atraumatic. Nose: No congestion/rhinnorhea. Mouth/Throat: Mucous membranes are moist.  Oropharynx non-erythematous. Neck: No stridor.  Cardiovascular: Normal rate, regular rhythm. Grossly normal heart sounds.  Good peripheral circulation. Respiratory: Normal respiratory effort.  No retractions. Lungs CTAB. Gastrointestinal: Soft Mild diffuse tenderness. No distention. No abdominal bruits. No CVA tenderness. Musculoskeletal: No lower extremity tenderness nor edema.  No joint effusions.   ____________________________________________   LABS (all labs ordered are listed, but only abnormal results are displayed)  Labs Reviewed  COMPREHENSIVE METABOLIC PANEL - Abnormal; Notable for the following:       Result Value   Potassium 3.3 (*)    CO2 21 (*)    Glucose, Bld 122 (*)    All other components within normal limits  CBC - Abnormal; Notable for the following:    Hemoglobin 16.2 (*)    All other components within normal limits  URINALYSIS COMPLETEWITH MICROSCOPIC ([facility] ONLY) - Abnormal; Notable for the following:     Color, Urine YELLOW (*)    APPearance HAZY (*)    Ketones, ur TRACE (*)    Hgb urine dipstick 1+ (*)    Bacteria, UA RARE (*)    Squamous Epithelial / LPF 0-5 (*)    All other components within normal limits  C DIFFICILE QUICK SCREEN W PCR REFLEX  GASTROINTESTINAL PANEL BY PCR, STOOL (REPLACES STOOL CULTURE)  LIPASE, BLOOD   ____________________________________________  EKG ____________________________________________  RADIOLOGY   ____________________________________________   PROCEDURES  Procedure(s) performed Procedures  Critical Care performed:  ____________________________________________   INITIAL IMPRESSION / ASSESSMENT AND PLAN / ED COURSE  Pertinent labs & imaging results that were available during my care of the patient were reviewed by me and considered in my medical decision making (  see chart for details).    Clinical Course    Patient has not had any diarrheal stools in the ER during the time she was here we give and gave her something to eat and drink and that did not provoke any diarrhea will let her go and we'll try to procure some of the specimen collection materials that she can bring a specimen back.  ____________________________________________   FINAL CLINICAL IMPRESSION(S) / ED DIAGNOSES  Final diagnoses:  Diarrhea in adult patient      NEW MEDICATIONS STARTED DURING THIS VISIT:  New Prescriptions   No medications on file     Note:  This document was prepared using Dragon voice recognition software and may include unintentional dictation errors.    Nena Polio, MD 06/29/16 2141

## 2016-06-29 NOTE — ED Notes (Signed)
Patient finally able to provide stool sample. MD spoke to patient and her husband. Plan is to still be discharged but to call her PCP in the morning for the results. Patient and her husband verbalize understanding.

## 2016-06-29 NOTE — ED Notes (Signed)
Patient able to hold down ginger ale and gram crackers. Patient states she does not feel as weak and her abdominal pain has improved.

## 2016-06-30 ENCOUNTER — Telehealth: Payer: Self-pay | Admitting: Emergency Medicine

## 2016-06-30 LAB — GASTROINTESTINAL PANEL BY PCR, STOOL (REPLACES STOOL CULTURE)

## 2016-06-30 LAB — C DIFFICILE QUICK SCREEN W PCR REFLEX
C DIFFICILE (CDIFF) INTERP: NOT DETECTED
C DIFFICILE (CDIFF) TOXIN: NEGATIVE
C DIFFICLE (CDIFF) ANTIGEN: NEGATIVE

## 2016-06-30 NOTE — Telephone Encounter (Signed)
Called patient to notify her of positive norovirus.  Discussed prevention of spread--handwashing, bleach to surfaces.   She says she has had the diarrhea for a month.  I advised that she should still follow up with dr Netty Starring.  She is keeping down fluids and ate some crackers this am.  Advised her to return if she became dehydrated.

## 2016-10-29 DIAGNOSIS — M85852 Other specified disorders of bone density and structure, left thigh: Secondary | ICD-10-CM | POA: Insufficient documentation

## 2016-11-15 ENCOUNTER — Ambulatory Visit (HOSPITAL_BASED_OUTPATIENT_CLINIC_OR_DEPARTMENT_OTHER): Payer: Medicare Other | Admitting: Hematology and Oncology

## 2016-11-15 ENCOUNTER — Encounter: Payer: Self-pay | Admitting: Hematology and Oncology

## 2016-11-15 DIAGNOSIS — Z79810 Long term (current) use of selective estrogen receptor modulators (SERMs): Secondary | ICD-10-CM | POA: Diagnosis not present

## 2016-11-15 DIAGNOSIS — D0511 Intraductal carcinoma in situ of right breast: Secondary | ICD-10-CM

## 2016-11-15 DIAGNOSIS — Z170 Estrogen receptor positive status [ER+]: Secondary | ICD-10-CM | POA: Diagnosis not present

## 2016-11-15 DIAGNOSIS — C50411 Malignant neoplasm of upper-outer quadrant of right female breast: Secondary | ICD-10-CM

## 2016-11-15 MED ORDER — ALENDRONATE SODIUM 70 MG PO TABS
70.0000 mg | ORAL_TABLET | ORAL | Status: DC
Start: 2016-11-15 — End: 2017-09-29

## 2016-11-15 NOTE — Progress Notes (Signed)
Patient Care Team: Dion Body, MD as PCP - General (Family Medicine) Rolm Bookbinder, MD as Consulting Physician (General Surgery) Nicholas Lose, MD as Consulting Physician (Hematology and Oncology) Kyung Rudd, MD as Consulting Physician (Radiation Oncology) Sylvan Cheese, NP as Nurse Practitioner (Hematology and Oncology)  DIAGNOSIS:  Encounter Diagnosis  Name Primary?  . Malignant neoplasm of upper-outer quadrant of right breast in female, estrogen receptor positive ([place])     SUMMARY OF ONCOLOGIC HISTORY:   Breast cancer of upper-outer quadrant of right female breast ([place])   10/03/2015 Mammogram    Screening mammo ([facility]). Right breast central to nipple with new grouped calcs. Breast US-right breast calcs suspicious for malignancy.       10/06/2015 Initial Biopsy    Right breast needle core biopsy: DCIS with comedonecrosis, high grade. ER 90%, PR 90%.       10/22/2015 Surgery    (R) Lumpectomy Donne Hazel): DCIS with calcs, intermediate grade, spanning 0.9 cm; Margins negative. ER/PR (+). pTis, pNx: Stage 0      11/18/2015 - 12/17/2015 Radiation Therapy     Right breast 42.5 Gy in 17 fractions.  Right breast boost 7.5 Gy in 3 fractions. Lisbeth Renshaw)      02/05/2016 -  Anti-estrogen oral therapy    Tamoxifen 20 mg daily. Planned duration of treatment: 5 years       CHIEF COMPLIANT: Tolerating tamoxifen fairly well  INTERVAL HISTORY: Melody Harding is a [age] year old with above-mentioned history of right breast DCIS treated with lumpectomy and radiation is currently on tamoxifen. She is tolerating tamoxifen extremely well. The hot flashes have subsided. She stays active and exercises on the treadmill. She was recently diagnosed with osteopenia and was started on alendronatic acid. She appears be tolerating that fairly well. She denies any lumps or nodules in the breast. Recent mammograms at [facility] were normal.  REVIEW OF SYSTEMS:   Constitutional: Denies fevers,  chills or abnormal weight loss Eyes: Denies blurriness of vision Ears, nose, mouth, throat, and face: Denies mucositis or sore throat Respiratory: Denies cough, dyspnea or wheezes Cardiovascular: Denies palpitation, chest discomfort Gastrointestinal:  Denies nausea, heartburn or change in bowel habits Skin: Denies abnormal skin rashes Lymphatics: Denies new lymphadenopathy or easy bruising Neurological:Denies numbness, tingling or new weaknesses Behavioral/Psych: Mood is stable, no new changes  Extremities: No lower extremity edema Breast:  denies any pain or lumps or nodules in either breasts All other systems were reviewed with the patient and are negative.  I have reviewed the past medical history, past surgical history, social history and family history with the patient and they are unchanged from previous note.  ALLERGIES:  has No Known Allergies.  MEDICATIONS:  Current Outpatient Prescriptions  Medication Sig Dispense Refill  . acetaminophen (TYLENOL) 500 MG tablet Take 500 mg by mouth as needed. Reported on 11/18/2015    . cetirizine-pseudoephedrine (ZYRTEC-D) 5-120 MG per tablet Take 1 tablet by mouth every morning. Reported on 02/26/2016    . docusate sodium (COLACE) 100 MG capsule Take 100 mg by mouth daily. Takes 2 capsules at bedtime, generic brand    . escitalopram (LEXAPRO) 20 MG tablet Take 20 mg by mouth daily.    Marland Kitchen esomeprazole (NEXIUM) 40 MG capsule Take 40 mg by mouth daily at 12 noon.    . fluticasone (FLONASE) 50 MCG/ACT nasal spray Place into both nostrils daily as needed. Reported on 10/15/2015    . ibuprofen (ADVIL,MOTRIN) 200 MG tablet Take 200 mg by mouth as needed. Reported  on 02/26/2016    . montelukast (SINGULAIR) 10 MG tablet Take 10 mg by mouth at bedtime.    . rosuvastatin (CRESTOR) 20 MG tablet Take 20 mg by mouth daily.    . tamoxifen (NOLVADEX) 20 MG tablet Take 1 tablet (20 mg total) by mouth daily. 90 tablet 3   No current facility-administered  medications for this visit.     PHYSICAL EXAMINATION: ECOG PERFORMANCE STATUS: 0 - Asymptomatic  Vitals:   11/15/16 1011  BP: (!) 133/57  Pulse: 79  Resp: 18  Temp: 98.2 F (36.8 C)   Filed Weights   11/15/16 1011  Weight: 176 lb 9.6 oz (80.1 kg)    GENERAL:alert, no distress and comfortable SKIN: skin color, texture, turgor are normal, no rashes or significant lesions EYES: normal, Conjunctiva are pink and non-injected, sclera clear OROPHARYNX:no exudate, no erythema and lips, buccal mucosa, and tongue normal  NECK: supple, thyroid normal size, non-tender, without nodularity LYMPH:  no palpable lymphadenopathy in the cervical, axillary or inguinal LUNGS: clear to auscultation and percussion with normal breathing effort HEART: regular rate & rhythm and no murmurs and no lower extremity edema ABDOMEN:abdomen soft, non-tender and normal bowel sounds MUSCULOSKELETAL:no cyanosis of digits and no clubbing  NEURO: alert & oriented x 3 with fluent speech, no focal motor/sensory deficits EXTREMITIES: No lower extremity edema   LABORATORY DATA:  I have reviewed the data as listed   Chemistry      Component Value Date/Time   NA 138 06/29/2016 1656   NA 138 10/15/2015 1220   K 3.3 (L) 06/29/2016 1656   K 4.0 10/15/2015 1220   CL 105 06/29/2016 1656   CO2 21 (L) 06/29/2016 1656   CO2 27 10/15/2015 1220   BUN 13 06/29/2016 1656   BUN 12.5 10/15/2015 1220   CREATININE 0.94 06/29/2016 1656   CREATININE 0.9 10/15/2015 1220      Component Value Date/Time   CALCIUM 9.3 06/29/2016 1656   CALCIUM 9.3 10/15/2015 1220   ALKPHOS 76 06/29/2016 1656   ALKPHOS 86 10/15/2015 1220   AST 15 06/29/2016 1656   AST 13 10/15/2015 1220   ALT 16 06/29/2016 1656   ALT 12 10/15/2015 1220   BILITOT 0.8 06/29/2016 1656   BILITOT <0.30 10/15/2015 1220       Lab Results  Component Value Date   WBC 8.4 06/29/2016   HGB 16.2 (H) 06/29/2016   HCT 46.0 06/29/2016   MCV 89.5 06/29/2016   PLT  223 06/29/2016   NEUTROABS 3.3 10/15/2015    ASSESSMENT & PLAN:  Breast cancer of upper-outer quadrant of right female breast ([facility]) Right breast calcifications on screening mammogram 10/06/2015:9 mm span, 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 Genia Harold) Adjuvant antiestrogen therapy with tamoxifen started 02/05/2016 Prognosis of DCIS: According to the DCIS nomogram, her 10 year risk of recurrence after tamoxifen therapy would be at 50%. With that tamoxifen would be at 7%.  Tamoxifen toxicities: 1. Hot flashes in the evening hours. Much improved than before Denies any myalgias.  Breast cancer surveillance: Mammograms at [facility] March 2018 were normal. Patient doesn't need that her neighbor had a biopsy of the breasts and most likely could be breast cancer and she is looking to come to our facility for her breast cancer care.  Return to clinic in 1 year for follow-up   I  spent 25 minutes talking to the patient of which more than half was spent in counseling and coordination of care.  No orders of the defined types were placed in this encounter.  The patient has a good understanding of the overall plan. she agrees with it. she will call with any problems that may develop before the next visit here.   Rulon Eisenmenger, MD 11/15/16

## 2016-11-15 NOTE — Assessment & Plan Note (Signed)
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 Genia Harold) Adjuvant antiestrogen therapy with tamoxifen started 02/05/2016 Prognosis of DCIS: According to the DCIS nomogram, her 10 year risk of recurrence after tamoxifen therapy would be at 50%. With that tamoxifen would be at 7%.  Tamoxifen toxicities: 1. Hot flashes in the evening hours. Much improved than before Denies any myalgias.  Return to clinic in 1 year for follow-up

## 2017-01-28 ENCOUNTER — Other Ambulatory Visit: Payer: Self-pay | Admitting: Emergency Medicine

## 2017-01-28 MED ORDER — TAMOXIFEN CITRATE 20 MG PO TABS: 20.0000 mg | ORAL_TABLET | Freq: Every day | ORAL | 3 refills | Status: DC

## 2017-09-29 ENCOUNTER — Encounter: Payer: Self-pay | Admitting: *Deleted

## 2017-09-29 ENCOUNTER — Other Ambulatory Visit: Payer: Self-pay

## 2017-10-03 NOTE — Discharge Instructions (Signed)

## 2017-10-05 ENCOUNTER — Ambulatory Visit: Payer: Medicare Other | Admitting: Anesthesiology

## 2017-10-05 ENCOUNTER — Encounter
Admission: RE | Disposition: A | Discharge status: Home / Self Care | Payer: Self-pay | Source: Ambulatory Visit | Attending: Ophthalmology

## 2017-10-05 ENCOUNTER — Ambulatory Visit
Admission: RE | Admit: 2017-10-05 | Discharge: 2017-10-05 | Disposition: A | Discharge status: Home / Self Care | Payer: Medicare Other | Source: Ambulatory Visit | Attending: Ophthalmology | Admitting: Ophthalmology

## 2017-10-05 DIAGNOSIS — E7800 Pure hypercholesterolemia, unspecified: Secondary | ICD-10-CM | POA: Diagnosis not present

## 2017-10-05 DIAGNOSIS — K219 Gastro-esophageal reflux disease without esophagitis: Secondary | ICD-10-CM | POA: Insufficient documentation

## 2017-10-05 DIAGNOSIS — F329 Major depressive disorder, single episode, unspecified: Secondary | ICD-10-CM | POA: Diagnosis not present

## 2017-10-05 DIAGNOSIS — H2511 Age-related nuclear cataract, right eye: Secondary | ICD-10-CM | POA: Insufficient documentation

## 2017-10-05 DIAGNOSIS — M810 Age-related osteoporosis without current pathological fracture: Secondary | ICD-10-CM | POA: Diagnosis not present

## 2017-10-05 DIAGNOSIS — Z853 Personal history of malignant neoplasm of breast: Secondary | ICD-10-CM | POA: Insufficient documentation

## 2017-10-05 DIAGNOSIS — R0683 Snoring: Secondary | ICD-10-CM | POA: Insufficient documentation

## 2017-10-05 HISTORY — PX: CATARACT EXTRACTION W/PHACO: SHX586

## 2017-10-05 SURGERY — PHACOEMULSIFICATION, CATARACT, WITH IOL INSERTION
Anesthesia: Monitor Anesthesia Care | Laterality: Right | Wound class: Clean

## 2017-10-05 MED ORDER — BSS IO SOLN
INTRAOCULAR | Status: DC | PRN
  Administered 2017-10-05: 56 mL via OPHTHALMIC

## 2017-10-05 MED ORDER — ARMC OPHTHALMIC DILATING DROPS
1.0000 "application " | OPHTHALMIC | Status: DC | PRN
  Administered 2017-10-05 (×3): 1 via OPHTHALMIC

## 2017-10-05 MED ORDER — NA HYALUR & NA CHOND-NA HYALUR 0.4-0.35 ML IO KIT
PACK | INTRAOCULAR | Status: DC | PRN
  Administered 2017-10-05: 1 mL via INTRAOCULAR

## 2017-10-05 MED ORDER — ACETAMINOPHEN 325 MG PO TABS: 650.0000 mg | ORAL_TABLET | Freq: Once | ORAL | Status: DC | PRN

## 2017-10-05 MED ORDER — BRIMONIDINE TARTRATE-TIMOLOL 0.2-0.5 % OP SOLN
OPHTHALMIC | Status: DC | PRN
  Administered 2017-10-05: 1 [drp] via OPHTHALMIC

## 2017-10-05 MED ORDER — FENTANYL CITRATE (PF) 100 MCG/2ML IJ SOLN
INTRAMUSCULAR | Status: DC | PRN
  Administered 2017-10-05: 50 ug via INTRAVENOUS

## 2017-10-05 MED ORDER — MOXIFLOXACIN HCL 0.5 % OP SOLN
1.0000 [drp] | OPHTHALMIC | Status: DC | PRN
  Administered 2017-10-05 (×3): 1 [drp] via OPHTHALMIC

## 2017-10-05 MED ORDER — CEFUROXIME OPHTHALMIC INJECTION 1 MG/0.1 ML
INJECTION | OPHTHALMIC | Status: DC | PRN
  Administered 2017-10-05: 0.1 mL via INTRACAMERAL

## 2017-10-05 MED ORDER — BALANCED SALT IO SOLN
INTRAOCULAR | Status: DC | PRN
  Administered 2017-10-05: 1 mL via INTRAMUSCULAR

## 2017-10-05 MED ORDER — LACTATED RINGERS IV SOLN: INTRAVENOUS | Status: DC

## 2017-10-05 MED ORDER — ACETAMINOPHEN 160 MG/5ML PO SOLN: 325.0000 mg | ORAL | Status: DC | PRN

## 2017-10-05 MED ORDER — MIDAZOLAM HCL 2 MG/2ML IJ SOLN
INTRAMUSCULAR | Status: DC | PRN
  Administered 2017-10-05: 2 mg via INTRAVENOUS

## 2017-10-05 MED ORDER — ONDANSETRON HCL 4 MG/2ML IJ SOLN: 4.0000 mg | Freq: Once | INTRAMUSCULAR | Status: DC | PRN

## 2017-10-05 SURGICAL SUPPLY — 27 items

## 2017-10-05 NOTE — Transfer of Care (Signed)
Immediate Anesthesia Transfer of Care Note  Patient: Melody Harding  Procedure(s) Performed: CATARACT EXTRACTION PHACO AND INTRAOCULAR LENS PLACEMENT (IOC) RIGHT (Right )  Patient Location: PACU  Anesthesia Type: MAC  Level of Consciousness: awake, alert  and patient cooperative  Airway and Oxygen Therapy: Patient Spontanous Breathing and Patient connected to supplemental oxygen  Post-op Assessment: Post-op Vital signs reviewed, Patient's Cardiovascular Status Stable, Respiratory Function Stable, Patent Airway and No signs of Nausea or vomiting  Post-op Vital Signs: Reviewed and stable  Complications: No apparent anesthesia complications

## 2017-10-05 NOTE — Op Note (Signed)
LOCATION:  [place]   PREOPERATIVE DIAGNOSIS:    Nuclear sclerotic cataract right eye. H25.11   POSTOPERATIVE DIAGNOSIS:  Nuclear sclerotic cataract right eye.     PROCEDURE:  Phacoemusification with posterior chamber intraocular lens placement of the right eye   LENS:   Implant Name Type Inv. Item Serial No. Manufacturer Lot No. LRB No. Used  LENS IOL DIOP 18.0 - [id number] Intraocular Lens LENS IOL DIOP 18.0 [id number]  Right 1        ULTRASOUND TIME: 15 % of 0 minutes, 58 seconds.  CDE 8.7   SURGEON:  Wyonia Hough, MD   ANESTHESIA:  Topical with tetracaine drops and 2% Xylocaine jelly, augmented with 1% preservative-free intracameral lidocaine.    COMPLICATIONS:  None.   DESCRIPTION OF PROCEDURE:  The patient was identified in the holding room and transported to the operating room and placed in the supine position under the operating microscope.  The right eye was identified as the operative eye and it was prepped and draped in the usual sterile ophthalmic fashion.   A 1 millimeter clear-corneal paracentesis was made at the 12:00 position.  0.5 ml of preservative-free 1% lidocaine was injected into the anterior chamber. The anterior chamber was filled with Viscoat viscoelastic.  A 2.4 millimeter keratome was used to make a near-clear corneal incision at the 9:00 position.  A curvilinear capsulorrhexis was made with a cystotome and capsulorrhexis forceps.  Balanced salt solution was used to hydrodissect and hydrodelineate the nucleus.   Phacoemulsification was then used in stop and chop fashion to remove the lens nucleus and epinucleus.  The remaining cortex was then removed using the irrigation and aspiration handpiece. Provisc was then placed into the capsular bag to distend it for lens placement.  A lens was then injected into the capsular bag.  The remaining viscoelastic was aspirated.   Wounds were hydrated with balanced salt solution.  The anterior  chamber was inflated to a physiologic pressure with balanced salt solution.  No wound leaks were noted. Cefuroxime 0.1 ml of a 10mg /ml solution was injected into the anterior chamber for a dose of 1 mg of intracameral antibiotic at the completion of the case.   Timolol and Brimonidine drops were applied to the eye.  The patient was taken to the recovery room in stable condition without complications of anesthesia or surgery.   Oaklan Persons 10/05/2017, 1:43 PM

## 2017-10-05 NOTE — H&P (Signed)
The History and Physical notes are on paper, have been signed, and are to be scanned. The patient remains stable and unchanged from the H&P.   Previous H&P reviewed, patient examined, and there are no changes.  Melody Harding 10/05/2017 12:58 PM

## 2017-10-05 NOTE — Anesthesia Preprocedure Evaluation (Signed)
Anesthesia Evaluation  Patient identified by MRN, date of birth, ID band Patient awake    Reviewed: Allergy & Precautions, NPO status , Patient's Chart, lab work & pertinent test results  History of Anesthesia Complications Negative for: history of anesthetic complications  Airway Mallampati: III  TM Distance: >3 FB Neck ROM: Full  Mouth opening: Limited Mouth Opening  Dental no notable dental hx.    Pulmonary  Snoring    Pulmonary exam normal breath sounds clear to auscultation       Cardiovascular Exercise Tolerance: Good negative cardio ROS Normal cardiovascular exam Rhythm:Regular Rate:Normal     Neuro/Psych PSYCHIATRIC DISORDERS Depression negative neurological ROS     GI/Hepatic GERD  ,  Endo/Other  negative endocrine ROS  Renal/GU negative Renal ROS     Musculoskeletal   Abdominal   Peds  Hematology Breast CA   Anesthesia Other Findings   Reproductive/Obstetrics                             Anesthesia Physical Anesthesia Plan  ASA: II  Anesthesia Plan: MAC   Post-op Pain Management:    Induction: Intravenous  PONV Risk Score and Plan: 2 and TIVA and Midazolam  Airway Management Planned: Natural Airway  Additional Equipment:   Intra-op Plan:   Post-operative Plan:   Informed Consent: I have reviewed the patients History and Physical, chart, labs and discussed the procedure including the risks, benefits and alternatives for the proposed anesthesia with the patient or authorized representative who has indicated his/her understanding and acceptance.     Plan Discussed with: CRNA  Anesthesia Plan Comments:         Anesthesia Quick Evaluation

## 2017-10-05 NOTE — Anesthesia Postprocedure Evaluation (Signed)
Anesthesia Post Note  Patient: Melody Harding  Procedure(s) Performed: CATARACT EXTRACTION PHACO AND INTRAOCULAR LENS PLACEMENT (IOC) RIGHT (Right )  Patient location during evaluation: PACU Anesthesia Type: MAC Level of consciousness: awake and alert, oriented and patient cooperative Pain management: pain level controlled Vital Signs Assessment: post-procedure vital signs reviewed and stable Respiratory status: spontaneous breathing, nonlabored ventilation and respiratory function stable Cardiovascular status: blood pressure returned to baseline and stable Postop Assessment: adequate PO intake Anesthetic complications: no    Darrin Nipper

## 2017-10-05 NOTE — Anesthesia Procedure Notes (Signed)
Procedure Name: MAC Date/Time: 10/05/2017 1:21 PM Performed by: Janna Arch, CRNA Pre-anesthesia Checklist: Patient identified, Emergency Drugs available, Suction available and Patient being monitored Patient Re-evaluated:Patient Re-evaluated prior to induction Oxygen Delivery Method: Nasal cannula

## 2017-10-27 NOTE — Discharge Instructions (Signed)

## 2017-11-01 NOTE — Anesthesia Preprocedure Evaluation (Signed)
Anesthesia Evaluation  Patient identified by MRN, date of birth, ID band Patient awake    Reviewed: Allergy & Precautions, NPO status , Patient's Chart, lab work & pertinent test results  History of Anesthesia Complications Negative for: history of anesthetic complications  Airway Mallampati: III  TM Distance: >3 FB Neck ROM: Full  Mouth opening: Limited Mouth Opening  Dental no notable dental hx.    Pulmonary  Snoring    Pulmonary exam normal breath sounds clear to auscultation       Cardiovascular Exercise Tolerance: Good negative cardio ROS Normal cardiovascular exam Rhythm:Regular Rate:Normal     Neuro/Psych PSYCHIATRIC DISORDERS Depression negative neurological ROS     GI/Hepatic GERD  ,  Endo/Other  negative endocrine ROS  Renal/GU negative Renal ROS     Musculoskeletal   Abdominal   Peds  Hematology Breast CA   Anesthesia Other Findings   Reproductive/Obstetrics                             Anesthesia Physical Anesthesia Plan  ASA: II  Anesthesia Plan: MAC   Post-op Pain Management:    Induction: Intravenous  PONV Risk Score and Plan: 2 and TIVA and Midazolam  Airway Management Planned: Natural Airway  Additional Equipment:   Intra-op Plan:   Post-operative Plan:   Informed Consent: I have reviewed the patients History and Physical, chart, labs and discussed the procedure including the risks, benefits and alternatives for the proposed anesthesia with the patient or authorized representative who has indicated his/her understanding and acceptance.     Plan Discussed with: CRNA  Anesthesia Plan Comments:         Anesthesia Quick Evaluation  

## 2017-11-02 ENCOUNTER — Encounter
Admission: RE | Disposition: A | Discharge status: Home / Self Care | Payer: Self-pay | Source: Ambulatory Visit | Attending: Ophthalmology

## 2017-11-02 ENCOUNTER — Ambulatory Visit: Payer: Medicare Other | Admitting: Anesthesiology

## 2017-11-02 ENCOUNTER — Ambulatory Visit
Admission: RE | Admit: 2017-11-02 | Discharge: 2017-11-02 | Disposition: A | Discharge status: Home / Self Care | Payer: Medicare Other | Source: Ambulatory Visit | Attending: Ophthalmology | Admitting: Ophthalmology

## 2017-11-02 DIAGNOSIS — H2512 Age-related nuclear cataract, left eye: Secondary | ICD-10-CM | POA: Diagnosis present

## 2017-11-02 DIAGNOSIS — Z853 Personal history of malignant neoplasm of breast: Secondary | ICD-10-CM | POA: Diagnosis not present

## 2017-11-02 HISTORY — PX: CATARACT EXTRACTION W/PHACO: SHX586

## 2017-11-02 SURGERY — PHACOEMULSIFICATION, CATARACT, WITH IOL INSERTION
Anesthesia: Monitor Anesthesia Care | Site: Eye | Laterality: Left | Wound class: Clean

## 2017-11-02 MED ORDER — MIDAZOLAM HCL 2 MG/2ML IJ SOLN
INTRAMUSCULAR | Status: DC | PRN
  Administered 2017-11-02: 2 mg via INTRAVENOUS

## 2017-11-02 MED ORDER — ARMC OPHTHALMIC DILATING DROPS
1.0000 "application " | OPHTHALMIC | Status: DC | PRN
  Administered 2017-11-02 (×3): 1 via OPHTHALMIC

## 2017-11-02 MED ORDER — LACTATED RINGERS IV SOLN: INTRAVENOUS | Status: DC

## 2017-11-02 MED ORDER — NA HYALUR & NA CHOND-NA HYALUR 0.4-0.35 ML IO KIT
PACK | INTRAOCULAR | Status: DC | PRN
  Administered 2017-11-02: 1 mL via INTRAOCULAR

## 2017-11-02 MED ORDER — ACETAMINOPHEN 325 MG PO TABS: 650.0000 mg | ORAL_TABLET | Freq: Once | ORAL | Status: DC | PRN

## 2017-11-02 MED ORDER — LIDOCAINE HCL (PF) 2 % IJ SOLN
INTRAOCULAR | Status: DC | PRN
  Administered 2017-11-02: 1 mL

## 2017-11-02 MED ORDER — NEOMYCIN-POLYMYXIN-DEXAMETH 3.5-10000-0.1 OP OINT
TOPICAL_OINTMENT | OPHTHALMIC | Status: DC | PRN
  Administered 2017-11-02: 1

## 2017-11-02 MED ORDER — ONDANSETRON HCL 4 MG/2ML IJ SOLN: 4.0000 mg | Freq: Once | INTRAMUSCULAR | Status: DC | PRN

## 2017-11-02 MED ORDER — CEFUROXIME OPHTHALMIC INJECTION 1 MG/0.1 ML
INJECTION | OPHTHALMIC | Status: DC | PRN
  Administered 2017-11-02: 0.1 mL via OPHTHALMIC

## 2017-11-02 MED ORDER — EPINEPHRINE PF 1 MG/ML IJ SOLN
INTRAOCULAR | Status: DC | PRN
  Administered 2017-11-02: 89 mL via OPHTHALMIC

## 2017-11-02 MED ORDER — FENTANYL CITRATE (PF) 100 MCG/2ML IJ SOLN
INTRAMUSCULAR | Status: DC | PRN
  Administered 2017-11-02: 50 ug via INTRAVENOUS

## 2017-11-02 MED ORDER — MOXIFLOXACIN HCL 0.5 % OP SOLN
1.0000 [drp] | OPHTHALMIC | Status: DC | PRN
  Administered 2017-11-02 (×3): 1 [drp] via OPHTHALMIC

## 2017-11-02 MED ORDER — ACETAMINOPHEN 160 MG/5ML PO SOLN: 325.0000 mg | ORAL | Status: DC | PRN

## 2017-11-02 MED ORDER — BRIMONIDINE TARTRATE-TIMOLOL 0.2-0.5 % OP SOLN
OPHTHALMIC | Status: DC | PRN
  Administered 2017-11-02: 1 [drp] via OPHTHALMIC

## 2017-11-02 SURGICAL SUPPLY — 27 items

## 2017-11-02 NOTE — Anesthesia Procedure Notes (Signed)
Procedure Name: MAC Performed by: Karrine Kluttz, CRNA Pre-anesthesia Checklist: Patient identified, Emergency Drugs available, Suction available, Timeout performed and Patient being monitored Patient Re-evaluated:Patient Re-evaluated prior to induction Oxygen Delivery Method: Nasal cannula Placement Confirmation: positive ETCO2       

## 2017-11-02 NOTE — H&P (Signed)
The History and Physical notes are on paper, have been signed, and are to be scanned. The patient remains stable and unchanged from the H&P.   Previous H&P reviewed, patient examined, and there are no changes.  Melody Harding 11/02/2017 9:20 AM'

## 2017-11-02 NOTE — Anesthesia Postprocedure Evaluation (Signed)
Anesthesia Post Note  Patient: Melody Harding  Procedure(s) Performed: CATARACT EXTRACTION PHACO AND INTRAOCULAR LENS PLACEMENT (IOC) LEFT (Left Eye)  Patient location during evaluation: PACU Anesthesia Type: MAC Level of consciousness: awake and alert, oriented and patient cooperative Pain management: pain level controlled Vital Signs Assessment: post-procedure vital signs reviewed and stable Respiratory status: spontaneous breathing, nonlabored ventilation and respiratory function stable Cardiovascular status: blood pressure returned to baseline and stable Postop Assessment: adequate PO intake Anesthetic complications: no    Darrin Nipper

## 2017-11-02 NOTE — Transfer of Care (Signed)
Immediate Anesthesia Transfer of Care Note  Patient: Melody Harding  Procedure(s) Performed: CATARACT EXTRACTION PHACO AND INTRAOCULAR LENS PLACEMENT (IOC) LEFT (Left Eye)  Patient Location: PACU  Anesthesia Type: MAC  Level of Consciousness: awake, alert  and patient cooperative  Airway and Oxygen Therapy: Patient Spontanous Breathing and Patient connected to supplemental oxygen  Post-op Assessment: Post-op Vital signs reviewed, Patient's Cardiovascular Status Stable, Respiratory Function Stable, Patent Airway and No signs of Nausea or vomiting  Post-op Vital Signs: Reviewed and stable  Complications: No apparent anesthesia complications

## 2017-11-02 NOTE — Op Note (Signed)
OPERATIVE NOTE  Melody Harding [id number] 11/02/2017   PREOPERATIVE DIAGNOSIS:  Nuclear sclerotic cataract left eye. H25.12   POSTOPERATIVE DIAGNOSIS:    Nuclear sclerotic cataract left eye.     PROCEDURE:  Phacoemusification with posterior chamber intraocular lens placement of the left eye   LENS:   Implant Name Type Inv. Item Serial No. Manufacturer Lot No. LRB No. Used  LENS IOL DIOP 19.5 - [id number] Intraocular Lens LENS IOL DIOP 19.5 [id number]  Left 1        ULTRASOUND TIME: 7  % of 0 minutes 58 seconds, CDE 4.4  SURGEON:  Wyonia Hough, MD   ANESTHESIA:  Topical with tetracaine drops and 2% Xylocaine jelly, augmented with 1% preservative-free intracameral lidocaine.    COMPLICATIONS:  None.   DESCRIPTION OF PROCEDURE:  The patient was identified in the holding room and transported to the operating room and placed in the supine position under the operating microscope.  The left eye was identified as the operative eye and it was prepped and draped in the usual sterile ophthalmic fashion.   A 1 millimeter clear-corneal paracentesis was made at the 1:30 position.  0.5 ml of preservative-free 1% lidocaine was injected into the anterior chamber.  The anterior chamber was filled with Viscoat viscoelastic.  A 2.4 millimeter keratome was used to make a near-clear corneal incision at the 10:30 position.  .  A curvilinear capsulorrhexis was made with a cystotome and capsulorrhexis forceps.  Balanced salt solution was used to hydrodissect and hydrodelineate the nucleus.   Phacoemulsification was then used in stop and chop fashion to remove the lens nucleus and epinucleus.  The remaining cortex was then removed using the irrigation and aspiration handpiece. Provisc was then placed into the capsular bag to distend it for lens placement.  A lens was then injected into the capsular bag.  The remaining viscoelastic was aspirated.   Wounds were hydrated with balanced salt  solution.  The anterior chamber was inflated to a physiologic pressure with balanced salt solution.  No wound leaks were noted. Cefuroxime 0.1 ml of a 10mg /ml solution was injected into the anterior chamber for a dose of 1 mg of intracameral antibiotic at the completion of the case.   Timolol and Brimonidine drops and Maxitrol ointment were applied to the eye.  The patient was taken to the recovery room in stable condition without complications of anesthesia or surgery.  Melody Harding 11/02/2017, 10:16 AM

## 2017-11-03 ENCOUNTER — Encounter: Payer: Self-pay | Admitting: Ophthalmology

## 2017-11-14 NOTE — Assessment & Plan Note (Signed)
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 Genia Harold) Adjuvant antiestrogen therapy with tamoxifen started 02/05/2016 Prognosis of DCIS: According to the DCIS nomogram, her 10 year risk of recurrence after tamoxifen therapy would be at 50%. With that tamoxifen would be at 7%.  Tamoxifen toxicities: 1. Hot flashes in the evening hours. Much improved than before Denies any myalgias.   Breast cancer Surveillance: 1. Breast exam: Benign 2. Mammograms: 10/05/16 Return to clinic in 1 year for follow-up

## 2017-11-15 ENCOUNTER — Inpatient Hospital Stay: Payer: Medicare Other | Attending: Hematology and Oncology | Admitting: Hematology and Oncology

## 2017-11-15 ENCOUNTER — Telehealth: Payer: Self-pay | Admitting: Hematology and Oncology

## 2017-11-15 DIAGNOSIS — N951 Menopausal and female climacteric states: Secondary | ICD-10-CM | POA: Diagnosis not present

## 2017-11-15 DIAGNOSIS — Z923 Personal history of irradiation: Secondary | ICD-10-CM

## 2017-11-15 DIAGNOSIS — Z170 Estrogen receptor positive status [ER+]: Secondary | ICD-10-CM | POA: Diagnosis not present

## 2017-11-15 DIAGNOSIS — D0511 Intraductal carcinoma in situ of right breast: Secondary | ICD-10-CM | POA: Insufficient documentation

## 2017-11-15 DIAGNOSIS — Z79899 Other long term (current) drug therapy: Secondary | ICD-10-CM | POA: Diagnosis not present

## 2017-11-15 DIAGNOSIS — C50411 Malignant neoplasm of upper-outer quadrant of right female breast: Secondary | ICD-10-CM

## 2017-11-15 MED ORDER — TAMOXIFEN CITRATE 20 MG PO TABS: 20.0000 mg | ORAL_TABLET | Freq: Every day | ORAL | 3 refills | Status: DC

## 2017-11-15 NOTE — Telephone Encounter (Signed)
Gave avs and calendar ° °

## 2017-11-15 NOTE — Progress Notes (Signed)
Patient Care Team: Dion Body, MD as PCP - General (Family Medicine) Rolm Bookbinder, MD as Consulting Physician (General Surgery) Nicholas Lose, MD as Consulting Physician (Hematology and Oncology) Kyung Rudd, MD as Consulting Physician (Radiation Oncology) Sylvan Cheese, NP as Nurse Practitioner (Hematology and Oncology)  DIAGNOSIS:  Encounter Diagnosis  Name Primary?  . Malignant neoplasm of upper-outer quadrant of right breast in female, estrogen receptor positive ([place])     SUMMARY OF ONCOLOGIC HISTORY:   Breast cancer of upper-outer quadrant of right female breast ([place])   10/03/2015 Mammogram    Screening mammo ([facility]). Right breast central to nipple with new grouped calcs. Breast US-right breast calcs suspicious for malignancy.       10/06/2015 Initial Biopsy    Right breast needle core biopsy: DCIS with comedonecrosis, high grade. ER 90%, PR 90%.       10/22/2015 Surgery    (R) Lumpectomy Donne Hazel): DCIS with calcs, intermediate grade, spanning 0.9 cm; Margins negative. ER/PR (+). pTis, pNx: Stage 0      11/18/2015 - 12/17/2015 Radiation Therapy     Right breast 42.5 Gy in 17 fractions.  Right breast boost 7.5 Gy in 3 fractions. Lisbeth Renshaw)      02/05/2016 -  Anti-estrogen oral therapy    Tamoxifen 20 mg daily. Planned duration of treatment: 5 years       CHIEF COMPLIANT: Follow-up on tamoxifen therapy  INTERVAL HISTORY: Melody Harding is a 68-year with above-mentioned history of right breast DCIS who underwent lumpectomy radiation is currently on tamoxifen therapy.  She is tolerating tamoxifen extremely well.  She does have hot flashes but able to manage.  She denies any lumps or nodules in the breast.  REVIEW OF SYSTEMS:   Constitutional: Denies fevers, chills or abnormal weight loss Eyes: Denies blurriness of vision Ears, nose, mouth, throat, and face: Denies mucositis or sore throat Respiratory: Denies cough, dyspnea or  wheezes Cardiovascular: Denies palpitation, chest discomfort Gastrointestinal:  Denies nausea, heartburn or change in bowel habits Skin: Denies abnormal skin rashes Lymphatics: Denies new lymphadenopathy or easy bruising Neurological:Denies numbness, tingling or new weaknesses Behavioral/Psych: Mood is stable, no new changes  Extremities: No lower extremity edema Breast:  denies any pain or lumps or nodules in either breasts All other systems were reviewed with the patient and are negative.  I have reviewed the past medical history, past surgical history, social history and family history with the patient and they are unchanged from previous note.  ALLERGIES:  has No Known Allergies.  MEDICATIONS:  Current Outpatient Medications  Medication Sig Dispense Refill  . acetaminophen (TYLENOL) 500 MG tablet Take 500 mg by mouth as needed. Reported on 11/18/2015    . cetirizine-pseudoephedrine (ZYRTEC-D) 5-120 MG per tablet Take 1 tablet by mouth every morning. Reported on 02/26/2016    . docusate sodium (COLACE) 100 MG capsule Take 100 mg by mouth daily. Takes 2 capsules at bedtime, generic brand    . escitalopram (LEXAPRO) 20 MG tablet Take 20 mg by mouth daily.    Marland Kitchen esomeprazole (NEXIUM) 40 MG capsule Take 40 mg by mouth daily at 12 noon.    . fluticasone (FLONASE) 50 MCG/ACT nasal spray Place into both nostrils daily as needed. Reported on 10/15/2015    . ibuprofen (ADVIL,MOTRIN) 200 MG tablet Take 200 mg by mouth as needed. Reported on 02/26/2016    . montelukast (SINGULAIR) 10 MG tablet Take 10 mg by mouth at bedtime.    . rosuvastatin (CRESTOR) 20 MG tablet  Take 20 mg by mouth daily.    . tamoxifen (NOLVADEX) 20 MG tablet Take 1 tablet (20 mg total) by mouth daily. 90 tablet 3   No current facility-administered medications for this visit.     PHYSICAL EXAMINATION: ECOG PERFORMANCE STATUS: 0 - Asymptomatic  Vitals:   11/15/17 0927  BP: (!) 141/67  Pulse: 81  Resp: 19  Temp: 98.4 F  (36.9 C)  SpO2: 100%   Filed Weights   11/15/17 0927  Weight: 177 lb 11.2 oz (80.6 kg)    GENERAL:alert, no distress and comfortable SKIN: skin color, texture, turgor are normal, no rashes or significant lesions EYES: normal, Conjunctiva are pink and non-injected, sclera clear OROPHARYNX:no exudate, no erythema and lips, buccal mucosa, and tongue normal  NECK: supple, thyroid normal size, non-tender, without nodularity LYMPH:  no palpable lymphadenopathy in the cervical, axillary or inguinal LUNGS: clear to auscultation and percussion with normal breathing effort HEART: regular rate & rhythm and no murmurs and no lower extremity edema ABDOMEN:abdomen soft, non-tender and normal bowel sounds MUSCULOSKELETAL:no cyanosis of digits and no clubbing  NEURO: alert & oriented x 3 with fluent speech, no focal motor/sensory deficits EXTREMITIES: No lower extremity edema BREAST: No palpable masses or nodules in either right or left breasts. No palpable axillary supraclavicular or infraclavicular adenopathy no breast tenderness or nipple discharge. (exam performed in the presence of a chaperone)  LABORATORY DATA:  I have reviewed the data as listed CMP Latest Ref Rng & Units 06/29/2016 10/15/2015  Glucose 65 - 99 mg/dL 122(H) 88  BUN 6 - 20 mg/dL 13 12.5  Creatinine 0.44 - 1.00 mg/dL 0.94 0.9  Sodium 135 - 145 mmol/L 138 138  Potassium 3.5 - 5.1 mmol/L 3.3(L) 4.0  Chloride 101 - 111 mmol/L 105 -  CO2 22 - 32 mmol/L 21(L) 27  Calcium 8.9 - 10.3 mg/dL 9.3 9.3  Total Protein 6.5 - 8.1 g/dL 7.1 6.8  Total Bilirubin 0.3 - 1.2 mg/dL 0.8 <0.30  Alkaline Phos 38 - 126 U/L 76 86  AST 15 - 41 U/L 15 13  ALT 14 - 54 U/L 16 12    Lab Results  Component Value Date   WBC 8.4 06/29/2016   HGB 16.2 (H) 06/29/2016   HCT 46.0 06/29/2016   MCV 89.5 06/29/2016   PLT 223 06/29/2016   NEUTROABS 3.3 10/15/2015    ASSESSMENT & PLAN:  Breast cancer of upper-outer quadrant of right female breast  ([facility]) Right breast calcifications on screening mammogram 10/06/2015:9 mm span, 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 Genia Harold) Adjuvant antiestrogen therapy with tamoxifen started 02/05/2016 Prognosis of DCIS: According to the DCIS nomogram, her 10 year risk of recurrence after tamoxifen therapy would be at 50%. With that tamoxifen would be at 7%.  Tamoxifen toxicities: 1. Hot flashes in the evening hours. Much improved than before Denies any myalgias.   Breast cancer Surveillance: 1. Breast exam: Benign 2. Mammograms: 10/05/16 Return to clinic in 1 year for follow-up    No orders of the defined types were placed in this encounter.  The patient has a good understanding of the overall plan. she agrees with it. she will call with any problems that may develop before the next visit here.   Harriette Ohara, MD 11/15/17

## 2018-11-13 ENCOUNTER — Telehealth: Payer: Self-pay | Admitting: Hematology and Oncology

## 2018-11-13 NOTE — Telephone Encounter (Signed)
R/s appt per 4/6 sch message - pt is aware of appt date and time

## 2018-11-16 ENCOUNTER — Ambulatory Visit: Payer: Medicare Other | Admitting: Hematology and Oncology

## 2018-12-30 ENCOUNTER — Other Ambulatory Visit: Payer: Self-pay | Admitting: Hematology and Oncology

## 2019-02-14 NOTE — Assessment & Plan Note (Signed)
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 Genia Harold) Adjuvant antiestrogen therapy with tamoxifen started 02/05/2016 Prognosis of DCIS: According to the DCIS nomogram, her 10 year risk of recurrence after tamoxifen therapy would be at 50%. With that tamoxifen would be at 7%.  Tamoxifen toxicities: 1. Hot flashes in the evening hours. Much improved than before Denies any myalgias.   Breast cancer Surveillance: 1. Breast exam: Benign 2. Mammograms: At [facility]  Return to clinic in 1 year for follow-up

## 2019-02-19 NOTE — Progress Notes (Signed)
Patient Care Team: Dion Body, MD as PCP - General (Family Medicine) Rolm Bookbinder, MD as Consulting Physician (General Surgery) Nicholas Lose, MD as Consulting Physician (Hematology and Oncology) Kyung Rudd, MD as Consulting Physician (Radiation Oncology) Sylvan Cheese, NP as Nurse Practitioner (Hematology and Oncology)  DIAGNOSIS:    ICD-10-CM   1. Malignant neoplasm of upper-outer quadrant of right breast in female, estrogen receptor positive ([place])  C50.411    Z17.0     SUMMARY OF ONCOLOGIC HISTORY: Oncology History  Breast cancer of upper-outer quadrant of right female breast ([place])  10/03/2015 Mammogram   Screening mammo ([facility]). Right breast central to nipple with new grouped calcs. Breast US-right breast calcs suspicious for malignancy.    10/06/2015 Initial Biopsy   Right breast needle core biopsy: DCIS with comedonecrosis, high grade. ER 90%, PR 90%.    10/22/2015 Surgery   (R) Lumpectomy Donne Hazel): DCIS with calcs, intermediate grade, spanning 0.9 cm; Margins negative. ER/PR (+). pTis, pNx: Stage 0   11/18/2015 - 12/17/2015 Radiation Therapy    Right breast 42.5 Gy in 17 fractions.  Right breast boost 7.5 Gy in 3 fractions. Lisbeth Renshaw)   02/05/2016 -  Anti-estrogen oral therapy   Tamoxifen 20 mg daily. Planned duration of treatment: 5 years     CHIEF COMPLIANT: Follow-up on tamoxifen therapy  INTERVAL HISTORY: Melody Harding is a 68 y.o. with above-mentioned history of right breast DCIS who underwent lumpectomy, radiation, and is currently on tamoxifen therapy. I last saw her over a year ago. She presents to the clinic today for annual follow-up.   REVIEW OF SYSTEMS:   Constitutional: Denies fevers, chills or abnormal weight loss Eyes: Denies blurriness of vision Ears, nose, mouth, throat, and face: Denies mucositis or sore throat Respiratory: Denies cough, dyspnea or wheezes Cardiovascular: Denies palpitation, chest discomfort  Gastrointestinal: Denies nausea, heartburn or change in bowel habits Skin: Denies abnormal skin rashes Lymphatics: Denies new lymphadenopathy or easy bruising Neurological: Denies numbness, tingling or new weaknesses Behavioral/Psych: Mood is stable, no new changes  Extremities: No lower extremity edema Breast: denies any pain or lumps or nodules in either breasts All other systems were reviewed with the patient and are negative.  I have reviewed the past medical history, past surgical history, social history and family history with the patient and they are unchanged from previous note.  ALLERGIES:  has No Known Allergies.  MEDICATIONS:  Current Outpatient Medications  Medication Sig Dispense Refill  . acetaminophen (TYLENOL) 500 MG tablet Take 500 mg by mouth as needed. Reported on 11/18/2015    . cetirizine-pseudoephedrine (ZYRTEC-D) 5-120 MG per tablet Take 1 tablet by mouth every morning. Reported on 02/26/2016    . docusate sodium (COLACE) 100 MG capsule Take 100 mg by mouth daily. Takes 2 capsules at bedtime, generic brand    . escitalopram (LEXAPRO) 20 MG tablet Take 20 mg by mouth daily.    Marland Kitchen esomeprazole (NEXIUM) 40 MG capsule Take 40 mg by mouth daily at 12 noon.    . fluticasone (FLONASE) 50 MCG/ACT nasal spray Place into both nostrils daily as needed. Reported on 10/15/2015    . ibuprofen (ADVIL,MOTRIN) 200 MG tablet Take 200 mg by mouth as needed. Reported on 02/26/2016    . montelukast (SINGULAIR) 10 MG tablet Take 10 mg by mouth at bedtime.    . rosuvastatin (CRESTOR) 20 MG tablet Take 20 mg by mouth daily.    . tamoxifen (NOLVADEX) 20 MG tablet TAKE 1 TABLET BY MOUTH EVERY DAY  90 tablet 0   No current facility-administered medications for this visit.     PHYSICAL EXAMINATION: ECOG PERFORMANCE STATUS: 1 - Symptomatic but completely ambulatory  Vitals:   02/20/19 0919  BP: (!) 127/54  Pulse: 88  Resp: 17  Temp: 98.9 F (37.2 C)  SpO2: 98%   Filed Weights    02/20/19 0919  Weight: 173 lb 8 oz (78.7 kg)    Physical exam not done due to COVID-19 precautions LABORATORY DATA:  I have reviewed the data as listed CMP Latest Ref Rng & Units 06/29/2016 10/15/2015  Glucose 65 - 99 mg/dL 122(H) 88  BUN 6 - 20 mg/dL 13 12.5  Creatinine 0.44 - 1.00 mg/dL 0.94 0.9  Sodium 135 - 145 mmol/L 138 138  Potassium 3.5 - 5.1 mmol/L 3.3(L) 4.0  Chloride 101 - 111 mmol/L 105 -  CO2 22 - 32 mmol/L 21(L) 27  Calcium 8.9 - 10.3 mg/dL 9.3 9.3  Total Protein 6.5 - 8.1 g/dL 7.1 6.8  Total Bilirubin 0.3 - 1.2 mg/dL 0.8 <0.30  Alkaline Phos 38 - 126 U/L 76 86  AST 15 - 41 U/L 15 13  ALT 14 - 54 U/L 16 12    Lab Results  Component Value Date   WBC 8.4 06/29/2016   HGB 16.2 (H) 06/29/2016   HCT 46.0 06/29/2016   MCV 89.5 06/29/2016   PLT 223 06/29/2016   NEUTROABS 3.3 10/15/2015    ASSESSMENT & PLAN:  Breast cancer of upper-outer quadrant of right female breast (HCC) Right breast calcifications on screening mammogram 10/06/2015:9 mm span, 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 Genia Harold) Adjuvant antiestrogen therapy with tamoxifen started 02/05/2016 Prognosis of DCIS: According to the DCIS nomogram, her 10 year risk of recurrence after tamoxifen therapy would be at 50%. With that tamoxifen would be at 7%.  Tamoxifen toxicities: 1. Hot flashes in the evening hours. Much improved than before Denies any myalgias.   Breast cancer Surveillance: 1. Breast exam: Benign 2. Mammograms: At [facility]  Return to clinic in 1 year for follow-up    No orders of the defined types were placed in this encounter.  The patient has a good understanding of the overall plan. she agrees with it. she will call with any problems that may develop before the next visit here.  Nicholas Lose, MD  02/20/2019  Julious Oka Dorshimer am acting as scribe for Dr. Nicholas Lose.  I have reviewed the above documentation for accuracy and completeness, and I agree with the above.

## 2019-02-20 ENCOUNTER — Other Ambulatory Visit: Payer: Self-pay

## 2019-02-20 ENCOUNTER — Inpatient Hospital Stay: Payer: Medicare Other | Attending: Hematology and Oncology | Admitting: Hematology and Oncology

## 2019-02-20 DIAGNOSIS — Z170 Estrogen receptor positive status [ER+]: Secondary | ICD-10-CM | POA: Insufficient documentation

## 2019-02-20 DIAGNOSIS — C50411 Malignant neoplasm of upper-outer quadrant of right female breast: Secondary | ICD-10-CM | POA: Insufficient documentation

## 2019-02-20 DIAGNOSIS — Z79810 Long term (current) use of selective estrogen receptor modulators (SERMs): Secondary | ICD-10-CM | POA: Diagnosis not present

## 2019-02-20 DIAGNOSIS — Z923 Personal history of irradiation: Secondary | ICD-10-CM

## 2019-02-20 MED ORDER — TAMOXIFEN CITRATE 20 MG PO TABS: 20.0000 mg | ORAL_TABLET | Freq: Every day | ORAL | 3 refills | Status: DC

## 2019-12-17 ENCOUNTER — Ambulatory Visit: Payer: Medicare PPO | Admitting: Dermatology

## 2019-12-17 ENCOUNTER — Other Ambulatory Visit: Payer: Self-pay

## 2019-12-17 DIAGNOSIS — Z85828 Personal history of other malignant neoplasm of skin: Secondary | ICD-10-CM

## 2019-12-17 DIAGNOSIS — L905 Scar conditions and fibrosis of skin: Secondary | ICD-10-CM

## 2019-12-17 DIAGNOSIS — L578 Other skin changes due to chronic exposure to nonionizing radiation: Secondary | ICD-10-CM | POA: Diagnosis not present

## 2019-12-17 DIAGNOSIS — Z1283 Encounter for screening for malignant neoplasm of skin: Secondary | ICD-10-CM

## 2019-12-17 DIAGNOSIS — D485 Neoplasm of uncertain behavior of skin: Secondary | ICD-10-CM | POA: Diagnosis not present

## 2019-12-17 DIAGNOSIS — D492 Neoplasm of unspecified behavior of bone, soft tissue, and skin: Secondary | ICD-10-CM

## 2019-12-17 DIAGNOSIS — D229 Melanocytic nevi, unspecified: Secondary | ICD-10-CM

## 2019-12-17 DIAGNOSIS — L821 Other seborrheic keratosis: Secondary | ICD-10-CM

## 2019-12-17 DIAGNOSIS — L814 Other melanin hyperpigmentation: Secondary | ICD-10-CM

## 2019-12-17 DIAGNOSIS — D1800 Hemangioma unspecified site: Secondary | ICD-10-CM

## 2019-12-17 NOTE — Progress Notes (Signed)
Follow-Up Visit   Subjective  Melody Harding is a 69 y.o. female who presents for the following: Annual Exam (Yearly TBSE, Hx of BCC ). Patient presents for total body skin exam for skin cancer screening and mole check.  The following portions of the chart were reviewed this encounter and updated as appropriate:  Tobacco  Allergies  Meds  Problems  Med Hx  Surg Hx  Fam Hx     Review of Systems:  No other skin or systemic complaints except as noted in HPI or Assessment and Plan.  Objective  Well appearing patient in no apparent distress; mood and affect are within normal limits.  A full examination was performed including scalp, head, eyes, ears, nose, lips, neck, chest, axillae, abdomen, back, buttocks, bilateral upper extremities, bilateral lower extremities, hands, feet, fingers, toes, fingernails, and toenails. All findings within normal limits unless otherwise noted below.  Objective  Right side above waistline: 0.8 0.5 cm irregular brown papule   Objective  Left palm at the base of little finger: 1.0 cm scar appearing area   Images     Assessment & Plan    Skin cancer screening performed today.  History of Basal Cell Carcinoma of the Skin Multiple sites-see history  - No evidence of recurrence today - Recommend regular full body skin exams - Recommend daily broad spectrum sunscreen SPF 30+ to sun-exposed areas, reapply every 2 hours as needed.  - Call if any new or changing lesions are noted between office visits   Actinic Damage - diffuse scaly erythematous macules with underlying dyspigmentation - Recommend daily broad spectrum sunscreen SPF 30+ to sun-exposed areas, reapply every 2 hours as needed.  - Call for new or changing lesions.  Seborrheic Keratoses - Stuck-on, waxy, tan-brown papules and plaques  - Discussed benign etiology and prognosis. - Observe - Call for any changes  Melanocytic Nevi - Tan-brown and/or pink-flesh-colored symmetric  macules and papules - Benign appearing on exam today - Observation - Call clinic for new or changing moles - Recommend daily use of broad spectrum spf 30+ sunscreen to sun-exposed areas.   Lentigines - Scattered tan macules - Discussed due to sun exposure - Benign, observe - Call for any changes  Hemangiomas - Red papules - Discussed benign nature - Observe - Call for any changes Neoplasm of skin Right side above waistline  Epidermal / dermal shaving  Lesion length (cm):  0.8 Lesion width (cm):  0.5 Margin per side (cm):  0.2 Total excision diameter (cm):  1.2 Informed consent: discussed and consent obtained   Timeout: patient name, date of birth, surgical site, and procedure verified   Procedure prep:  Patient was prepped and draped in usual sterile fashion Prep type:  Isopropyl alcohol Anesthesia: the lesion was anesthetized in a standard fashion   Anesthetic:  1% lidocaine w/ epinephrine 1-100,000 buffered w/ 8.4% NaHCO3 Hemostasis achieved with: pressure, aluminum chloride and electrodesiccation   Outcome: patient tolerated procedure well   Post-procedure details: sterile dressing applied and wound care instructions given   Dressing type: bandage and petrolatum    Specimen 1 - Surgical pathology Differential Diagnosis: R/o Dysplastic nevus  Check Margins: No 0.8 0.5 cm irregular brown papule  Scar Left palm at the base of little finger  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   Observe  Return in about 1 year (around 12/16/2020) for TBSE . IMarye Round, CMA, am acting as scribe for Sarina Ser, MD .  Documentation: I have reviewed the above documentation for accuracy and completeness, and I agree with the above.  Sarina Ser, MD

## 2019-12-17 NOTE — Patient Instructions (Signed)

## 2019-12-19 ENCOUNTER — Telehealth: Payer: Self-pay

## 2019-12-19 NOTE — Telephone Encounter (Signed)
Discussed biopsy results with pt  °

## 2019-12-31 ENCOUNTER — Encounter: Payer: Self-pay | Admitting: Dermatology

## 2020-02-20 NOTE — Progress Notes (Signed)
Patient Care Team: Dion Body, MD as PCP - General (Family Medicine) Rolm Bookbinder, MD as Consulting Physician (General Surgery) Nicholas Lose, MD as Consulting Physician (Hematology and Oncology) Kyung Rudd, MD as Consulting Physician (Radiation Oncology) Sylvan Cheese, NP as Nurse Practitioner (Hematology and Oncology)  DIAGNOSIS:    ICD-10-CM   1. Malignant neoplasm of upper-outer quadrant of right breast in female, estrogen receptor positive ([place])  C50.411    Z17.0     SUMMARY OF ONCOLOGIC HISTORY: Oncology History  Breast cancer of upper-outer quadrant of right female breast ([place])  10/03/2015 Mammogram   Screening mammo ([facility]). Right breast central to nipple with new grouped calcs. Breast US-right breast calcs suspicious for malignancy.    10/06/2015 Initial Biopsy   Right breast needle core biopsy: DCIS with comedonecrosis, high grade. ER 90%, PR 90%.    10/22/2015 Surgery   (R) Lumpectomy Donne Hazel): DCIS with calcs, intermediate grade, spanning 0.9 cm; Margins negative. ER/PR (+). pTis, pNx: Stage 0   11/18/2015 - 12/17/2015 Radiation Therapy    Right breast 42.5 Gy in 17 fractions.  Right breast boost 7.5 Gy in 3 fractions. Lisbeth Renshaw)   02/05/2016 -  Anti-estrogen oral therapy   Tamoxifen 20 mg daily. Planned duration of treatment: 5 years     CHIEF COMPLIANT: Follow-up of right breast DCIS on tamoxifen therapy  INTERVAL HISTORY: Melody Harding is a 69 y.o. with above-mentioned history of right breast DCIS who underwent lumpectomy, radiation, and is currently on tamoxifen therapy. She presents to the clinic today for annual follow-up.   She has been tolerating tamoxifen extremely well without any problems or concerns.  Denies any lumps or nodules in the breast.  She has lost some weight by exercising and watching what she eats.  ALLERGIES:  has No Known Allergies.  MEDICATIONS:  Current Outpatient Medications  Medication Sig Dispense Refill    . acetaminophen (TYLENOL) 500 MG tablet Take 500 mg by mouth as needed. Reported on 11/18/2015    . cetirizine-pseudoephedrine (ZYRTEC-D) 5-120 MG per tablet Take 1 tablet by mouth every morning. Reported on 02/26/2016    . docusate sodium (COLACE) 100 MG capsule Take 100 mg by mouth daily. Takes 2 capsules at bedtime, generic brand    . escitalopram (LEXAPRO) 20 MG tablet Take 20 mg by mouth daily.    Marland Kitchen esomeprazole (NEXIUM) 40 MG capsule Take 40 mg by mouth daily at 12 noon.    . fluticasone (FLONASE) 50 MCG/ACT nasal spray Place into both nostrils daily as needed. Reported on 10/15/2015    . ibuprofen (ADVIL,MOTRIN) 200 MG tablet Take 200 mg by mouth as needed. Reported on 02/26/2016    . montelukast (SINGULAIR) 10 MG tablet Take 10 mg by mouth at bedtime.    . rosuvastatin (CRESTOR) 20 MG tablet Take 20 mg by mouth daily.    . tamoxifen (NOLVADEX) 20 MG tablet Take 1 tablet (20 mg total) by mouth daily. 90 tablet 3   No current facility-administered medications for this visit.    PHYSICAL EXAMINATION: ECOG PERFORMANCE STATUS: 1 - Symptomatic but completely ambulatory  Vitals:   02/21/20 0858  BP: (!) 135/56  Pulse: 70  Resp: 17  Temp: 98.5 F (36.9 C)  SpO2: 98%   Filed Weights   02/21/20 0858  Weight: 163 lb 3.2 oz (74 kg)    BREAST: No palpable masses or nodules in either right or left breasts. No palpable axillary supraclavicular or infraclavicular adenopathy no breast tenderness or nipple discharge. (exam  performed in the presence of a chaperone)  LABORATORY DATA:  I have reviewed the data as listed CMP Latest Ref Rng & Units 06/29/2016 10/15/2015  Glucose 65 - 99 mg/dL 122(H) 88  BUN 6 - 20 mg/dL 13 12.5  Creatinine 0.44 - 1.00 mg/dL 0.94 0.9  Sodium 135 - 145 mmol/L 138 138  Potassium 3.5 - 5.1 mmol/L 3.3(L) 4.0  Chloride 101 - 111 mmol/L 105 -  CO2 22 - 32 mmol/L 21(L) 27  Calcium 8.9 - 10.3 mg/dL 9.3 9.3  Total Protein 6.5 - 8.1 g/dL 7.1 6.8  Total Bilirubin 0.3 -  1.2 mg/dL 0.8 <0.30  Alkaline Phos 38 - 126 U/L 76 86  AST 15 - 41 U/L 15 13  ALT 14 - 54 U/L 16 12    Lab Results  Component Value Date   WBC 8.4 06/29/2016   HGB 16.2 (H) 06/29/2016   HCT 46.0 06/29/2016   MCV 89.5 06/29/2016   PLT 223 06/29/2016   NEUTROABS 3.3 10/15/2015    ASSESSMENT & PLAN:  Breast cancer of upper-outer quadrant of right female breast (HCC) Right breast calcifications on screening mammogram 10/06/2015:9 mm span, 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 Genia Harold) Adjuvant antiestrogen therapy with tamoxifen started 02/05/2016 Prognosis of DCIS: According to the DCIS nomogram, her 10 year risk of recurrence after tamoxifen therapy would be at 50%. With that tamoxifen would be at 7%.  Tamoxifen toxicities: Denies any adverse effects with tamoxifen.  Osteoporosis: Currently on Reclast   Breast cancer Surveillance: 1. Breast exam 02/21/2020: Benign 2. Mammograms: At [facility]  Return to clinic in 1 year for follow-up    No orders of the defined types were placed in this encounter.  The patient has a good understanding of the overall plan. she agrees with it. she will call with any problems that may develop before the next visit here.  Total time spent: 20 mins including face to face time and time spent for planning, charting and coordination of care  Nicholas Lose, MD 02/21/2020  I, Cloyde Reams Dorshimer, am acting as scribe for Dr. Nicholas Lose.  I have reviewed the above documentation for accuracy and completeness, and I agree with the above.

## 2020-02-21 ENCOUNTER — Other Ambulatory Visit: Payer: Self-pay

## 2020-02-21 ENCOUNTER — Telehealth: Payer: Self-pay | Admitting: Hematology and Oncology

## 2020-02-21 ENCOUNTER — Inpatient Hospital Stay: Payer: Medicare PPO | Attending: Hematology and Oncology | Admitting: Hematology and Oncology

## 2020-02-21 DIAGNOSIS — C50411 Malignant neoplasm of upper-outer quadrant of right female breast: Secondary | ICD-10-CM | POA: Diagnosis not present

## 2020-02-21 DIAGNOSIS — D0511 Intraductal carcinoma in situ of right breast: Secondary | ICD-10-CM | POA: Diagnosis not present

## 2020-02-21 DIAGNOSIS — Z170 Estrogen receptor positive status [ER+]: Secondary | ICD-10-CM | POA: Diagnosis not present

## 2020-02-21 DIAGNOSIS — Z79899 Other long term (current) drug therapy: Secondary | ICD-10-CM | POA: Diagnosis not present

## 2020-02-21 DIAGNOSIS — Z79810 Long term (current) use of selective estrogen receptor modulators (SERMs): Secondary | ICD-10-CM | POA: Insufficient documentation

## 2020-02-21 MED ORDER — VITAMIN D 25 MCG (1000 UNIT) PO TABS: 1000.0000 [IU] | ORAL_TABLET | Freq: Every day | ORAL | Status: AC

## 2020-02-21 MED ORDER — ZOLEDRONIC ACID 5 MG/100ML IV SOLN: 5.0000 mg | Freq: Once | INTRAVENOUS | 0 refills | Status: AC

## 2020-02-21 MED ORDER — TAMOXIFEN CITRATE 20 MG PO TABS: 20.0000 mg | ORAL_TABLET | Freq: Every day | ORAL | 3 refills | Status: DC

## 2020-02-21 NOTE — Assessment & Plan Note (Signed)
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 Genia Harold) Adjuvant antiestrogen therapy with tamoxifen started 02/05/2016 Prognosis of DCIS: According to the DCIS nomogram, her 10 year risk of recurrence after tamoxifen therapy would be at 50%. With that tamoxifen would be at 7%.  Tamoxifen toxicities: 1. Hot flashes in the evening hours. Much improved than before Denies any myalgias.   Breast cancer Surveillance: 1. Breast exam 02/21/2020: Benign 2. Mammograms: At [facility]  Return to clinic in 1 year for follow-up

## 2020-02-21 NOTE — Telephone Encounter (Signed)
Scheduled appts per 7/15 los. Pt declined print out of AVS.

## 2020-12-17 ENCOUNTER — Encounter: Payer: Self-pay | Admitting: Dermatology

## 2020-12-17 ENCOUNTER — Ambulatory Visit: Payer: Medicare PPO | Admitting: Dermatology

## 2020-12-17 ENCOUNTER — Other Ambulatory Visit: Payer: Self-pay

## 2020-12-17 DIAGNOSIS — L821 Other seborrheic keratosis: Secondary | ICD-10-CM

## 2020-12-17 DIAGNOSIS — L578 Other skin changes due to chronic exposure to nonionizing radiation: Secondary | ICD-10-CM | POA: Diagnosis not present

## 2020-12-17 DIAGNOSIS — L732 Hidradenitis suppurativa: Secondary | ICD-10-CM | POA: Diagnosis not present

## 2020-12-17 DIAGNOSIS — Z85828 Personal history of other malignant neoplasm of skin: Secondary | ICD-10-CM | POA: Diagnosis not present

## 2020-12-17 DIAGNOSIS — D1800 Hemangioma unspecified site: Secondary | ICD-10-CM

## 2020-12-17 DIAGNOSIS — L814 Other melanin hyperpigmentation: Secondary | ICD-10-CM

## 2020-12-17 DIAGNOSIS — D229 Melanocytic nevi, unspecified: Secondary | ICD-10-CM

## 2020-12-17 DIAGNOSIS — Z1283 Encounter for screening for malignant neoplasm of skin: Secondary | ICD-10-CM

## 2020-12-17 MED ORDER — DOXYCYCLINE HYCLATE 100 MG PO TABS: ORAL_TABLET | ORAL | 0 refills | Status: DC

## 2020-12-17 MED ORDER — MUPIROCIN 2 % EX OINT: TOPICAL_OINTMENT | CUTANEOUS | 3 refills | Status: DC

## 2020-12-17 NOTE — Progress Notes (Signed)
Follow-Up Visit   Subjective  Melody Harding is a 70 y.o. female who presents for the following: Annual Exam (Hx BCC ). The patient presents for Total-Body Skin Exam (TBSE) for skin cancer screening and mole check.  The following portions of the chart were reviewed this encounter and updated as appropriate:   Tobacco  Allergies  Meds  Problems  Med Hx  Surg Hx  Fam Hx     Review of Systems:  No other skin or systemic complaints except as noted in HPI or Assessment and Plan.  Objective  Well appearing patient in no apparent distress; mood and affect are within normal limits.  A full examination was performed including scalp, head, eyes, ears, nose, lips, neck, chest, axillae, abdomen, back, buttocks, bilateral upper extremities, bilateral lower extremities, hands, feet, fingers, toes, fingernails, and toenails. All findings within normal limits unless otherwise noted below.   Assessment & Plan  Hidradenitis suppurativa Groin  Hx of inflamed cysts in groin region - Hidradenitis Suppurativa is a chronic; persistent; non-curable, but treatable condition due to abnormal inflamed sweat glands in the body folds (axilla, inframammary, groin, medial thighs), causing recurrent painful cysts and scarring. It can be associated with severe scarring acne and cysts; abscesses and scarring of scalp. The goal is control and prevention of flares, as it is not curable. Scars are permanent and can be thickened. Treatment may include daily use of topical medication and oral antibiotics.  Oral isotretinoin may also be helpful.  For more severe cases, Humira (a biologic injection) may be prescribed to decrease the inflammatory process and prevent flares.  When indicated, inflamed cysts may also be treated surgically.   Start Mupirocin 2% ointment to aa's BID and Doxycycline 100mg  po BID x 1 week during flares. #60 0RF. Doxycycline should be taken with food to prevent nausea. Do not lay down for 30  minutes after taking. Be cautious with sun exposure and use good sun protection while on this medication. Pregnant women should not take this medication. '   mupirocin ointment (BACTROBAN) 2 % - Groin  doxycycline (VIBRA-TABS) 100 MG tablet - Groin  Skin cancer screening   Lentigines - Scattered tan macules - Due to sun exposure - Benign-appering, observe - Recommend daily broad spectrum sunscreen SPF 30+ to sun-exposed areas, reapply every 2 hours as needed. - Call for any changes  Seborrheic Keratoses - Stuck-on, waxy, tan-brown papules and/or plaques  - Benign-appearing - Discussed benign etiology and prognosis. - Observe - Call for any changes  Melanocytic Nevi - Tan-brown and/or pink-flesh-colored symmetric macules and papules - Benign appearing on exam today - Observation - Call clinic for new or changing moles - Recommend daily use of broad spectrum spf 30+ sunscreen to sun-exposed areas.   Hemangiomas - Red papules - Discussed benign nature - Observe - Call for any changes  Actinic Damage - Chronic condition, secondary to cumulative UV/sun exposure - diffuse scaly erythematous macules with underlying dyspigmentation - Recommend daily broad spectrum sunscreen SPF 30+ to sun-exposed areas, reapply every 2 hours as needed.  - Staying in the shade or wearing long sleeves, sun glasses (UVA+UVB protection) and wide brim hats (4-inch brim around the entire circumference of the hat) are also recommended for sun protection.  - Call for new or changing lesions.  History of Basal Cell Carcinoma of the Skin - No evidence of recurrence today - Recommend regular full body skin exams - Recommend daily broad spectrum sunscreen SPF 30+ to sun-exposed areas, reapply  every 2 hours as needed.  - Call if any new or changing lesions are noted between office visits  Skin cancer screening performed today.  Return in about 1 year (around 12/17/2021) for TBSE.  Luther Redo, CMA,  am acting as scribe for Sarina Ser, MD .  Documentation: I have reviewed the above documentation for accuracy and completeness, and I agree with the above.  Sarina Ser, MD

## 2020-12-17 NOTE — Patient Instructions (Addendum)
If you have any questions or concerns for your doctor, please call our main line at [phone] and press option 4 to reach your doctor's medical assistant. If no one answers, please leave a voicemail as directed and we will return your call as soon as possible. Messages left after 4 pm will be answered the following business day.   You may also send [place] a message via [place]. We typically respond to MyChart messages within 1-2 business days.  For prescription refills, please ask your pharmacy to contact our office. Our fax number is [phone].  If you have an urgent issue when the clinic is closed that cannot wait until the next business day, you can page your doctor at the number below.    Please note that while we do our best to be available for urgent issues outside of office hours, we are not available 24/7.   If you have an urgent issue and are unable to reach [place], you may choose to seek medical care at your doctor's office, retail clinic, urgent care center, or emergency room.  If you have a medical emergency, please immediately call 911 or go to the emergency department.  Pager Numbers  - Dr. Nehemiah Massed: [phone]  - Dr. Laurence Ferrari: [phone]  - Dr. Nicole Kindred: [phone]  In the event of inclement weather, please call our main line at [phone] for an update on the status of any delays or closures.  Dermatology Medication Tips: Please keep the boxes that topical medications come in in order to help keep track of the instructions about where and how to use these. Pharmacies typically print the medication instructions only on the boxes and not directly on the medication tubes.   If your medication is too expensive, please contact our office at [phone] option 4 or send [place] a message through [place].   We are unable to tell what your co-pay for medications will be in advance as this is different depending on your insurance coverage. However, we may be able to find a substitute  medication at lower cost or fill out paperwork to get insurance to cover a needed medication.   If a prior authorization is required to get your medication covered by your insurance company, please allow [place] 1-2 business days to complete this process.  Drug prices often vary depending on where the prescription is filled and some pharmacies may offer cheaper prices.  The website www.goodrx.com contains coupons for medications through different pharmacies. The prices here do not account for what the cost may be with help from insurance (it may be cheaper with your insurance), but the website can give you the price if you did not use any insurance.  - You can print the associated coupon and take it with your prescription to the pharmacy.  - You may also stop by our office during regular business hours and pick up a GoodRx coupon card.  - If you need your prescription sent electronically to a different pharmacy, notify our office through [facility] or by phone at [phone] option 4.  Hidradenitis Suppurativa Hidradenitis suppurativa is a long-term (chronic) skin disease. It is similar to a severe form of acne, but it affects areas of the body where acne would be unusual, especially areas of the body where skin rubs against skin and becomes moist. These include:  Underarms.  Groin.  Genital area.  Buttocks.  Upper thighs.  Breasts. Hidradenitis suppurativa may start out as small lumps or pimples caused by blocked sweat glands  or hair follicles. Pimples may develop into deep sores that break open (rupture) and drain pus. Over time, affected areas of skin may thicken and become scarred. This condition is rare and does not spread from person to person (non-contagious). What are the causes? The exact cause of this condition is not known. It may be related to:  Female and female hormones.  An overactive disease-fighting system (immune system). The immune system may over-react to blocked  hair follicles or sweat glands and cause swelling and pus-filled sores. What increases the risk? You are more likely to develop this condition if you:  Are female.  Are 14-24 years old.  Have a family history of hidradenitis suppurativa.  Have a personal history of acne.  Are overweight.  Smoke.  Take the medicine lithium. What are the signs or symptoms? The first symptoms are usually painful bumps in the skin, similar to pimples. The condition may get worse over time (progress), or it may only cause mild symptoms. If the disease progresses, symptoms may include:  Skin bumps getting bigger and growing deeper into the skin.  Bumps rupturing and draining pus.  Itchy, infected skin.  Skin getting thicker and scarred.  Tunnels under the skin (fistulas) where pus drains from a bump.  Pain during daily activities, such as pain during walking if your groin area is affected.  Emotional problems, such as stress or depression. This condition may affect your appearance and your ability or willingness to wear certain clothes or do certain activities. How is this diagnosed? This condition is diagnosed by a health care provider who specializes in skin diseases (dermatologist). You may be diagnosed based on:  Your symptoms and medical history.  A physical exam.  Testing a pus sample for infection.  Blood tests. How is this treated? Your treatment will depend on how severe your symptoms are. The same treatment will not work for everybody with this condition. You may need to try several treatments to find what works best for you. Treatment may include:  Cleaning and bandaging (dressing) your wounds as needed.  Lifestyle changes, such as new skin care routines.  Taking medicines, such as: ? Antibiotics. ? Acne medicines. ? Medicines to reduce the activity of the immune system. ? A diabetes medicine (metformin). ? Birth control pills, for women. ? Steroids to reduce swelling and  pain.  Working with a mental health care provider, if you experience emotional distress due to this condition. If you have severe symptoms that do not get better with medicine, you may need surgery. Surgery may involve:  Using a laser to clear the skin and remove hair follicles.  Opening and draining deep sores.  Removing the areas of skin that are diseased and scarred. Follow these instructions at home: Medicines  Take over-the-counter and prescription medicines only as told by your health care provider.  If you were prescribed an antibiotic medicine, take it as told by your health care provider. Do not stop taking the antibiotic even if your condition improves.   Skin care  If you have open wounds, cover them with a clean dressing as told by your health care provider. Keep wounds clean by washing them gently with soap and water when you bathe.  Do not shave the areas where you get hidradenitis suppurativa.  Do not wear deodorant.  Wear loose-fitting clothes.  Try to avoid getting overheated or sweaty. If you get sweaty or wet, change into clean, dry clothes as soon as you can.  To help  relieve pain and itchiness, cover sore areas with a warm, clean washcloth (warm compress) for 5-10 minutes as often as needed.  If told by your health care provider, take a bleach bath twice a week: ? Fill your bathtub halfway with water. ? Pour in  cup of unscented household bleach. ? Soak in the tub for 5-10 minutes. ? Only soak from the neck down. Avoid water on your face and hair. ? Shower to rinse off the bleach from your skin. General instructions  Learn as much as you can about your disease so that you have an active role in your treatment. Work closely with your health care provider to find treatments that work for you.  If you are overweight, work with your health care provider to lose weight as recommended.  Do not use any products that contain nicotine or tobacco, such as  cigarettes and e-cigarettes. If you need help quitting, ask your health care provider.  If you struggle with living with this condition, talk with your health care provider or work with a mental health care provider as recommended.  Keep all follow-up visits as told by your health care provider. This is important. Where to find more information  Hidradenitis Normal.: https://www.hs-foundation.org/  American Academy of Dermatology: http://www.nguyen-hutchinson.com/ Contact a health care provider if you have:  A flare-up of hidradenitis suppurativa.  A fever or chills.  Trouble controlling your symptoms at home.  Trouble doing your daily activities because of your symptoms.  Trouble dealing with emotional problems related to your condition. Summary  Hidradenitis suppurativa is a long-term (chronic) skin disease. It is similar to a severe form of acne, but it affects areas of the body where acne would be unusual.  The first symptoms are usually painful bumps in the skin, similar to pimples. The condition may only cause mild symptoms, or it may get worse over time (progress).  If you have open wounds, cover them with a clean dressing as told by your health care provider. Keep wounds clean by washing them gently with soap and water when you bathe.  Besides skin care, treatment may include medicines, laser treatment, and surgery. This information is not intended to replace advice given to you by your health care provider. Make sure you discuss any questions you have with your health care provider. Document Revised: 05/20/2020 Document Reviewed: 05/20/2020 Elsevier Patient Education  2021 [employer].

## 2020-12-23 ENCOUNTER — Encounter: Payer: Self-pay | Admitting: Dermatology

## 2021-01-14 ENCOUNTER — Other Ambulatory Visit: Payer: Self-pay | Admitting: Dermatology

## 2021-01-14 DIAGNOSIS — L732 Hidradenitis suppurativa: Secondary | ICD-10-CM

## 2021-02-01 ENCOUNTER — Other Ambulatory Visit: Payer: Self-pay | Admitting: Hematology and Oncology

## 2021-02-19 NOTE — Progress Notes (Signed)
Patient Care Team: Dion Body, MD as PCP - General (Family Medicine) Rolm Bookbinder, MD as Consulting Physician (General Surgery) Nicholas Lose, MD as Consulting Physician (Hematology and Oncology) Kyung Rudd, MD as Consulting Physician (Radiation Oncology) Sylvan Cheese, NP as Nurse Practitioner (Hematology and Oncology)  DIAGNOSIS:    ICD-10-CM   1. Malignant neoplasm of upper-outer quadrant of right breast in female, estrogen receptor positive ([place])  C50.411    Z17.0       SUMMARY OF ONCOLOGIC HISTORY: Oncology History  Breast cancer of upper-outer quadrant of right female breast ([place])  10/03/2015 Mammogram   Screening mammo ([facility]). Right breast central to nipple with new grouped calcs. Breast US-right breast calcs suspicious for malignancy.     10/06/2015 Initial Biopsy   Right breast needle core biopsy: DCIS with comedonecrosis, high grade. ER 90%, PR 90%.     10/22/2015 Surgery   (R) Lumpectomy [facility]): DCIS with calcs, intermediate grade, spanning 0.9 cm; Margins negative. ER/PR (+). pTis, pNx: Stage 0    11/18/2015 - 12/17/2015 Radiation Therapy    Right breast 42.5 Gy in 17 fractions.  Right breast boost 7.5 Gy in 3 fractions. Lisbeth Renshaw)    02/05/2016 -  Anti-estrogen oral therapy   Tamoxifen 20 mg daily. 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 with above-mentioned history of right breast DCIS who underwent lumpectomy, radiation, and is currently on tamoxifen therapy. She presents to the clinic today for annual follow-up.  She has completed 5 years of antiestrogen therapy.  She has done extremely well from tamoxifen.  She was diagnosed with osteoporosis and receives Reclast once a year.  ALLERGIES:  has No Known Allergies.  MEDICATIONS:  Current Outpatient Medications  Medication Sig Dispense Refill   acetaminophen (TYLENOL)  500 MG tablet Take 500 mg by mouth as needed. Reported on 11/18/2015     cetirizine-pseudoephedrine (ZYRTEC-D) 5-120 MG per tablet Take 1 tablet by mouth every morning. Reported on 02/26/2016     cholecalciferol (VITAMIN D3) 25 MCG (1000 UNIT) tablet Take 1 tablet (1,000 Units total) by mouth daily.     docusate sodium (COLACE) 100 MG capsule Take 100 mg by mouth daily. Takes 2 capsules at bedtime, generic brand     doxycycline (VIBRA-TABS) 100 MG tablet TAKE ONE TAB BY MOUTH TWICE A DAY X 1 WEEK AS NEEDED FOR FLARES. TAKE WITH FOOD. 60 tablet 0   escitalopram (LEXAPRO) 20 MG tablet Take 20 mg by mouth daily.     esomeprazole (NEXIUM) 40 MG capsule Take 40 mg by mouth daily at 12 noon.     fluticasone (FLONASE) 50 MCG/ACT nasal spray Place into both nostrils daily as needed. Reported on 10/15/2015     ibuprofen (ADVIL,MOTRIN) 200 MG tablet Take 200 mg by mouth as needed. Reported on 02/26/2016     montelukast (SINGULAIR) 10 MG tablet Take 10 mg by mouth at bedtime.     mupirocin ointment (BACTROBAN) 2 % Apply to aa's BID PRN flares. 22 g 3   rosuvastatin (CRESTOR) 20 MG tablet Take 20 mg by mouth daily.     tamoxifen (NOLVADEX) 20 MG tablet TAKE 1 TABLET BY MOUTH EVERY DAY 90 tablet 3   No current facility-administered medications for this visit.    PHYSICAL EXAMINATION: ECOG PERFORMANCE STATUS: 1 - Symptomatic but completely ambulatory  Vitals:   02/20/21 0956  BP: Mar[place] Kitchen)  133/58  Pulse: (!) 59  Resp: 18  Temp: 97.7 F (36.5 C)  SpO2: 100%   Filed Weights   02/20/21 0956  Weight: 136 lb 1.6 oz (61.7 kg)     LABORATORY DATA:  I have reviewed the data as listed CMP Latest Ref Rng & Units 06/29/2016 10/15/2015  Glucose 65 - 99 mg/dL 122(H) 88  BUN 6 - 20 mg/dL 13 12.5  Creatinine 0.44 - 1.00 mg/dL 0.94 0.9  Sodium 135 - 145 mmol/L 138 138  Potassium 3.5 - 5.1 mmol/L 3.3(L) 4.0  Chloride 101 - 111 mmol/L 105 -  CO2 22 - 32 mmol/L 21(L) 27  Calcium 8.9 - 10.3 mg/dL 9.3 9.3  Total  Protein 6.5 - 8.1 g/dL 7.1 6.8  Total Bilirubin 0.3 - 1.2 mg/dL 0.8 <0.30  Alkaline Phos 38 - 126 U/L 76 86  AST 15 - 41 U/L 15 13  ALT 14 - 54 U/L 16 12    Lab Results  Component Value Date   WBC 8.4 06/29/2016   HGB 16.2 (H) 06/29/2016   HCT 46.0 06/29/2016   MCV 89.5 06/29/2016   PLT 223 06/29/2016   NEUTROABS 3.3 10/15/2015    ASSESSMENT & PLAN:  Breast cancer of upper-outer quadrant of right female breast ([facility]) Right breast calcifications on screening mammogram 10/06/2015:9 mm span, 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 Genia Harold) Adjuvant antiestrogen therapy with tamoxifen started 02/05/2016 Prognosis of DCIS: According to the DCIS nomogram, her 10 year risk of recurrence after tamoxifen therapy would be at 50%. With that tamoxifen would be at 7%.   Tamoxifen toxicities: Denies any adverse effects with tamoxifen. Since she completed 5 years of therapy she can discontinue tamoxifen at this time.   Osteoporosis: Currently on Reclast    Breast cancer Surveillance:  Mammograms: At [facility] ([place] 10/07/2020: Benign breast density category B   Return to clinic on an as-needed basis.   No orders of the defined types were placed in this encounter.  The patient has a good understanding of the overall plan. she agrees with it. she will call with any problems that may develop before the next visit here.  Total time spent: 20 mins including face to face time and time spent for planning, charting and coordination of care  Melody Eisenmenger, MD, MPH 02/20/2021  I, Thana Ates, am acting as scribe for Dr. Nicholas Lose.  I have reviewed the above documentation for accuracy and completeness, and I agree with the above.

## 2021-02-20 ENCOUNTER — Inpatient Hospital Stay: Payer: Medicare PPO | Attending: Hematology and Oncology | Admitting: Hematology and Oncology

## 2021-02-20 ENCOUNTER — Other Ambulatory Visit: Payer: Self-pay

## 2021-02-20 DIAGNOSIS — Z170 Estrogen receptor positive status [ER+]: Secondary | ICD-10-CM

## 2021-02-20 DIAGNOSIS — C50411 Malignant neoplasm of upper-outer quadrant of right female breast: Secondary | ICD-10-CM | POA: Diagnosis present

## 2021-02-20 DIAGNOSIS — Z79899 Other long term (current) drug therapy: Secondary | ICD-10-CM | POA: Insufficient documentation

## 2021-02-20 DIAGNOSIS — Z79810 Long term (current) use of selective estrogen receptor modulators (SERMs): Secondary | ICD-10-CM | POA: Diagnosis not present

## 2021-02-20 NOTE — Assessment & Plan Note (Addendum)
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 Genia Harold) Adjuvant antiestrogen therapy with tamoxifen started 02/05/2016 Prognosis of DCIS: According to the DCIS nomogram, her 10 year risk of recurrence after tamoxifen therapy would be at 50%. With that tamoxifen would be at 7%.  Tamoxifen toxicities: Denies any adverse effects with tamoxifen.  Osteoporosis: Currently on Reclast  Breast cancer Surveillance: 1. Breast exam 02/20/2021: Benign 2. Mammograms:At [facility] 10/07/2020: Benign breast density category B  Return to clinic in 1 year for follow-up

## 2021-11-27 ENCOUNTER — Ambulatory Visit
Admission: RE | Admit: 2021-11-27 | Discharge: 2021-11-27 | Disposition: A | Discharge status: Home / Self Care | Payer: Medicare PPO | Attending: Gastroenterology | Admitting: Gastroenterology

## 2021-11-27 ENCOUNTER — Encounter
Admission: RE | Disposition: A | Discharge status: Home / Self Care | Payer: Self-pay | Source: Home / Self Care | Attending: Gastroenterology

## 2021-11-27 ENCOUNTER — Ambulatory Visit: Payer: Medicare PPO | Admitting: Certified Registered"

## 2021-11-27 ENCOUNTER — Encounter: Payer: Self-pay | Admitting: *Deleted

## 2021-11-27 DIAGNOSIS — K219 Gastro-esophageal reflux disease without esophagitis: Secondary | ICD-10-CM | POA: Diagnosis not present

## 2021-11-27 DIAGNOSIS — D120 Benign neoplasm of cecum: Secondary | ICD-10-CM | POA: Insufficient documentation

## 2021-11-27 DIAGNOSIS — Z1211 Encounter for screening for malignant neoplasm of colon: Secondary | ICD-10-CM | POA: Insufficient documentation

## 2021-11-27 DIAGNOSIS — Z79899 Other long term (current) drug therapy: Secondary | ICD-10-CM | POA: Insufficient documentation

## 2021-11-27 DIAGNOSIS — E785 Hyperlipidemia, unspecified: Secondary | ICD-10-CM | POA: Insufficient documentation

## 2021-11-27 DIAGNOSIS — F32A Depression, unspecified: Secondary | ICD-10-CM | POA: Diagnosis not present

## 2021-11-27 DIAGNOSIS — D122 Benign neoplasm of ascending colon: Secondary | ICD-10-CM | POA: Insufficient documentation

## 2021-11-27 DIAGNOSIS — Z85828 Personal history of other malignant neoplasm of skin: Secondary | ICD-10-CM | POA: Diagnosis not present

## 2021-11-27 DIAGNOSIS — Z86010 Personal history of colonic polyps: Secondary | ICD-10-CM | POA: Insufficient documentation

## 2021-11-27 DIAGNOSIS — Z853 Personal history of malignant neoplasm of breast: Secondary | ICD-10-CM | POA: Insufficient documentation

## 2021-11-27 DIAGNOSIS — M8580 Other specified disorders of bone density and structure, unspecified site: Secondary | ICD-10-CM | POA: Insufficient documentation

## 2021-11-27 HISTORY — PX: COLONOSCOPY WITH PROPOFOL: SHX5780

## 2021-11-27 HISTORY — DX: Other specified disorders of bone density and structure, unspecified site: M85.80

## 2021-11-27 SURGERY — COLONOSCOPY WITH PROPOFOL
Anesthesia: Monitor Anesthesia Care

## 2021-11-27 MED ORDER — LIDOCAINE HCL (CARDIAC) PF 100 MG/5ML IV SOSY
PREFILLED_SYRINGE | INTRAVENOUS | Status: DC | PRN
  Administered 2021-11-27: 50 mg via INTRAVENOUS

## 2021-11-27 MED ORDER — PROPOFOL 10 MG/ML IV BOLUS
INTRAVENOUS | Status: DC | PRN
  Administered 2021-11-27: 50 mg via INTRAVENOUS

## 2021-11-27 MED ORDER — GLYCOPYRROLATE 0.2 MG/ML IJ SOLN
INTRAMUSCULAR | Status: DC | PRN
  Administered 2021-11-27: .2 mg via INTRAVENOUS

## 2021-11-27 MED ORDER — PROPOFOL 500 MG/50ML IV EMUL
INTRAVENOUS | Status: DC | PRN
  Administered 2021-11-27: 125 ug/kg/min via INTRAVENOUS

## 2021-11-27 MED ORDER — SODIUM CHLORIDE 0.9 % IV SOLN
INTRAVENOUS | Status: DC
  Administered 2021-11-27: 1000 mL via INTRAVENOUS

## 2021-11-27 NOTE — Anesthesia Postprocedure Evaluation (Signed)
Anesthesia Post Note ? ?Patient: Melody Harding ? ?Procedure(s) Performed: COLONOSCOPY WITH PROPOFOL ? ?Patient location during evaluation: Endoscopy ?Anesthesia Type: MAC ?Level of consciousness: awake and alert ?Pain management: pain level controlled ?Vital Signs Assessment: post-procedure vital signs reviewed and stable ?Respiratory status: spontaneous breathing, nonlabored ventilation, respiratory function stable and patient connected to nasal cannula oxygen ?Cardiovascular status: blood pressure returned to baseline and stable ?Postop Assessment: no apparent nausea or vomiting ?Anesthetic complications: no ? ? ?No notable events documented. ? ? ?Last Vitals:  ?Vitals:  ? 11/27/21 1410 11/27/21 1420  ?BP: (!) 104/51 (!) 123/52  ?Pulse: 62 (!) 56  ?Resp: 12 17  ?Temp:    ?SpO2: 99% 99%  ?  ?Last Pain:  ?Vitals:  ? 11/27/21 1420  ?TempSrc:   ?PainSc: 0-No pain  ? ? ?  ?  ?  ?  ?  ?  ? ?Martha Clan ? ? ? ? ?

## 2021-11-27 NOTE — Anesthesia Preprocedure Evaluation (Signed)
Anesthesia Evaluation  ?Patient identified by MRN, date of birth, ID band ?Patient awake ? ? ? ?Reviewed: ?Allergy & Precautions, NPO status , Patient's Chart, lab work & pertinent test results ? ?History of Anesthesia Complications ?Negative for: history of anesthetic complications ? ?Airway ?Mallampati: III ? ?TM Distance: >3 FB ?Neck ROM: Full ? ?Mouth opening: Limited Mouth Opening ? Dental ?no notable dental hx. ? ?  ?Pulmonary ?neg pulmonary ROS,  ?Snoring  ?  ?Pulmonary exam normal ?breath sounds clear to auscultation ? ? ? ? ? ? Cardiovascular ?Exercise Tolerance: Good ?negative cardio ROS ?Normal cardiovascular exam ?Rhythm:Regular Rate:Normal ? ? ?  ?Neuro/Psych ?PSYCHIATRIC DISORDERS Depression negative neurological ROS ?   ? GI/Hepatic ?GERD  ,  ?Endo/Other  ?negative endocrine ROS ? Renal/GU ?negative Renal ROS  ? ?  ?Musculoskeletal ? ? Abdominal ?  ?Peds ? Hematology ?Breast CA   ?Anesthesia Other Findings ?Past Medical History: ?08/15/2018: Basal cell carcinoma ?    Comment:  right chest infraclavicular ?12/29/2016: Basal cell carcinoma ?    Comment:  left mid lat forearm ?05/10/2016: Basal cell carcinoma ?    Comment:  right distal pretibial ?02/05/2016: Basal cell carcinoma ?    Comment:  left mid pretibial, left mid distal pretibial, left mid  ?             distal pretibial above ankle ?11/17/2015: Basal cell carcinoma ?    Comment:  right mid lat pretibial sup, right mid lat pretibial inf ?No date: Breast cancer ([place]) ?10/10/2015: Cancer of central portion of female breast, right ?No date: Depression ?No date: GERD (gastroesophageal reflux disease) ?1993: H/O bone density study ?2012: H/O colonoscopy ?No date: Hyperlipemia ?No date: Osteopenia ?    Comment:  of neck and left femur ?No date: Seasonal allergies ?No date: Wears glasses ?No date: Wears hearing aid ?    Comment:  bilateral ? ? Reproductive/Obstetrics ? ?  ? ? ? ? ? ? ? ? ? ? ? ? ? ?  ?   ? ? ? ? ? ? ? ? ?Anesthesia Physical ? ?Anesthesia Plan ? ?ASA: 2 ? ?Anesthesia Plan: MAC and General  ? ?Post-op Pain Management:   ? ?Induction: Intravenous ? ?PONV Risk Score and Plan: 3 and TIVA and Midazolam ? ?Airway Management Planned: Natural Airway ? ?Additional Equipment:  ? ?Intra-op Plan:  ? ?Post-operative Plan:  ? ?Informed Consent: I have reviewed the patients History and Physical, chart, labs and discussed the procedure including the risks, benefits and alternatives for the proposed anesthesia with the patient or authorized representative who has indicated his/her understanding and acceptance.  ? ? ? ? ? ?Plan Discussed with: CRNA ? ?Anesthesia Plan Comments:   ? ? ? ? ? ? ?Anesthesia Quick Evaluation ? ?

## 2021-11-27 NOTE — Op Note (Signed)
[facility] ?Gastroenterology ?Patient Name: Melody Harding ?Procedure Date: 11/27/2021 1:19 PM ?MRN: [id number] ?Account #: [id number] ?Date of Birth: Feb 04, 1951 ?Admit Type: Outpatient ?Age: 71 ?Room: [facility] ENDO ROOM 3 ?Gender: Female ?Note Status: Finalized ?Instrument Name: Colonoscope [id number] ?Procedure:             Colonoscopy ?Indications:           Surveillance: Personal history of colonic polyps  ?                       (unknown histology) on last colonoscopy more than 5  ?                       years ago ?Providers:             Andrey Farmer MD, MD ?Referring MD:          Dion Body (Referring MD) ?Medicines:             Monitored Anesthesia Care ?Complications:         No immediate complications. Estimated blood loss:  ?                       Minimal. ?Procedure:             Pre-Anesthesia Assessment: ?                       - Prior to the procedure, a History and Physical was  ?                       performed, and patient medications and allergies were  ?                       reviewed. The patient is competent. The risks and  ?                       benefits of the procedure and the sedation options and  ?                       risks were discussed with the patient. All questions  ?                       were answered and informed consent was obtained.  ?                       Patient identification and proposed procedure were  ?                       verified by the physician, the nurse, the  ?                       anesthesiologist, the anesthetist and the technician  ?                       in the endoscopy suite. Mental Status Examination:  ?                       alert and oriented. Airway Examination: normal  ?  oropharyngeal airway and neck mobility. Respiratory  ?                       Examination: clear to auscultation. CV Examination:  ?                       normal. Prophylactic Antibiotics: The patient does not  ?                       require  prophylactic antibiotics. Prior  ?                       Anticoagulants: The patient has taken no previous  ?                       anticoagulant or antiplatelet agents. ASA Grade  ?                       Assessment: II - A patient with mild systemic disease.  ?                       After reviewing the risks and benefits, the patient  ?                       was deemed in satisfactory condition to undergo the  ?                       procedure. The anesthesia plan was to use monitored  ?                       anesthesia care (MAC). Immediately prior to  ?                       administration of medications, the patient was  ?                       re-assessed for adequacy to receive sedatives. The  ?                       heart rate, respiratory rate, oxygen saturations,  ?                       blood pressure, adequacy of pulmonary ventilation, and  ?                       response to care were monitored throughout the  ?                       procedure. The physical status of the patient was  ?                       re-assessed after the procedure. ?                       After obtaining informed consent, the colonoscope was  ?                       passed under direct vision. Throughout the procedure,  ?  the patient's blood pressure, pulse, and oxygen  ?                       saturations were monitored continuously. The  ?                       Colonoscope was introduced through the anus and  ?                       advanced to the the cecum, identified by appendiceal  ?                       orifice and ileocecal valve. The colonoscopy was  ?                       performed without difficulty. The patient tolerated  ?                       the procedure well. The quality of the bowel  ?                       preparation was good. ?Findings: ?     The perianal and digital rectal examinations were normal. ?     A diffuse area of moderate melanosis was found in the entire colon. ?     A 4 mm  polyp was found in the cecum. The polyp was sessile. The polyp  ?     was removed with a cold snare. Resection and retrieval were complete.  ?     Estimated blood loss was minimal. ?     A 2 mm polyp was found in the ascending colon. The polyp was sessile.  ?     The polyp was removed with a jumbo cold forceps. Resection and retrieval  ?     were complete. Estimated blood loss was minimal. ?     A 4 mm polyp was found in the transverse colon. The polyp was sessile.  ?     The polyp was removed with a cold snare. Resection and retrieval were  ?     complete. Estimated blood loss was minimal. ?     The exam was otherwise without abnormality on direct and retroflexion  ?     views. ?Impression:            - Melanosis in the colon. ?                       - One 4 mm polyp in the cecum, removed with a cold  ?                       snare. Resected and retrieved. ?                       - One 2 mm polyp in the ascending colon, removed with  ?                       a jumbo cold forceps. Resected and retrieved. ?                       - One 4 mm polyp in the transverse colon, removed with  ?  a cold snare. Resected and retrieved. ?                       - The examination was otherwise normal on direct and  ?                       retroflexion views. ?Recommendation:        - Discharge patient to home. ?                       - Resume previous diet. ?                       - Continue present medications. ?                       - Await pathology results. ?                       - Repeat colonoscopy for surveillance based on  ?                       pathology results. ?                       - Return to referring physician as previously  ?                       scheduled. ?Procedure Code(s):     --- Professional --- ?                       [id number], Colonoscopy, flexible; with removal of  ?                       tumor(s), polyp(s), or other lesion(s) by snare  ?                       technique ?                        45380, 59, Colonoscopy, flexible; with biopsy, single  ?                       or multiple ?Diagnosis Code(s):     --- Professional --- ?                       Z86.010, Personal history of colonic polyps ?                       K63.89, Other specified diseases of intestine ?                       K63.5, Polyp of colon ?CPT copyright 2019 American Medical Association. All rights reserved. ?The codes documented in this report are preliminary and upon coder review may  ?be revised to meet current compliance requirements. ?Andrey Farmer MD, MD ?11/27/2021 2:01:33 PM ?Number of Addenda: 0 ?Note Initiated On: 11/27/2021 1:19 PM ?Scope Withdrawal Time: 0 hours 9 minutes 57 seconds  ?Total Procedure Duration: 0 hours 17 minutes 29 seconds  ?Estimated Blood Loss:  Estimated blood loss was minimal. ?     [facility] ?

## 2021-11-27 NOTE — Transfer of Care (Signed)
Immediate Anesthesia Transfer of Care Note ? ?Patient: Melody Harding ? ?Procedure(s) Performed: COLONOSCOPY WITH PROPOFOL ? ?Patient Location: PACU and Endoscopy Unit ? ?Anesthesia Type:General ? ?Level of Consciousness: awake ? ?Airway & Oxygen Therapy: Patient Spontanous Breathing ? ?Post-op Assessment: Report given to RN ? ?Post vital signs: stable ? ?Last Vitals:  ?Vitals Value Taken Time  ?BP    ?Temp    ?Pulse    ?Resp    ?SpO2    ? ? ?Last Pain:  ?Vitals:  ? 11/27/21 1237  ?TempSrc: Temporal  ?PainSc: 0-No pain  ?   ? ?  ? ?Complications: No notable events documented. ?

## 2021-11-27 NOTE — H&P (Signed)
Outpatient short stay form Pre-procedure ?11/27/2021  ?Melody Rubenstein, MD ? ?Primary Physician: Melody Body, MD ? ?Reason for visit:  Surveillance colonoscopy ? ?History of present illness:   ? ?71 y/o lady with history of hyperlipidemia here for surveillance colonoscopy. Last colonoscopy in 2017 with unknown type of polyp. No blood thinners. No family history of GI malignancies. No significant abdominal surgeries. ? ? ? ?Current Facility-Administered Medications:  ?  0.9 %  sodium chloride infusion, , Intravenous, Continuous, Xzayvier Harding, Melody Cork, MD, Last Rate: 20 mL/hr at 11/27/21 1317, Continued from Pre-op at 11/27/21 1317 ? ?Medications Prior to Admission  ?Medication Sig Dispense Refill Last Dose  ? acetaminophen (TYLENOL) 500 MG tablet Take 500 mg by mouth as needed. Reported on 11/18/2015   Past Month  ? BACILLUS COAGULANS-INULIN PO Take by mouth.   Past Month  ? cholecalciferol (VITAMIN D3) 25 MCG (1000 UNIT) tablet Take 1 tablet (1,000 Units total) by mouth daily.   Past Week  ? docusate sodium (COLACE) 100 MG capsule Take 100 mg by mouth daily. Takes 2 capsules at bedtime, generic brand   Past Week  ? doxycycline (VIBRA-TABS) 100 MG tablet TAKE ONE TAB BY MOUTH TWICE A DAY X 1 WEEK AS NEEDED FOR FLARES. TAKE WITH FOOD. 60 tablet 0 Past Month  ? escitalopram (LEXAPRO) 20 MG tablet Take 20 mg by mouth daily.   Past Week  ? esomeprazole (NEXIUM) 40 MG capsule Take 40 mg by mouth daily at 12 noon.   Past Week  ? fluticasone (FLONASE) 50 MCG/ACT nasal spray Place into both nostrils daily as needed. Reported on 10/15/2015   Past Week  ? ibuprofen (ADVIL,MOTRIN) 200 MG tablet Take 200 mg by mouth as needed. Reported on 02/26/2016   Past Week  ? montelukast (SINGULAIR) 10 MG tablet Take 10 mg by mouth at bedtime.   Past Week  ? mupirocin ointment (BACTROBAN) 2 % Apply to aa's BID PRN flares. 22 g 3 Past Week  ? rosuvastatin (CRESTOR) 20 MG tablet Take 20 mg by mouth daily.   Past Week  ? ? ? ?Allergies   ?Allergen Reactions  ? Fosamax [Alendronate] Other (See Comments)  ? ? ? ?Past Medical History:  ?Diagnosis Date  ? Basal cell carcinoma 08/15/2018  ? right chest infraclavicular  ? Basal cell carcinoma 12/29/2016  ? left mid lat forearm  ? Basal cell carcinoma 05/10/2016  ? right distal pretibial  ? Basal cell carcinoma 02/05/2016  ? left mid pretibial, left mid distal pretibial, left mid distal pretibial above ankle  ? Basal cell carcinoma 11/17/2015  ? right mid lat pretibial sup, right mid lat pretibial inf  ? Breast cancer ([place])   ? Cancer of central portion of female breast, right 10/10/2015  ? Depression   ? GERD (gastroesophageal reflux disease)   ? H/O bone density study 1993  ? H/O colonoscopy 2012  ? Hyperlipemia   ? Osteopenia   ? of neck and left femur  ? Seasonal allergies   ? Wears glasses   ? Wears hearing aid   ? bilateral  ? ? ?Review of systems:  Otherwise negative.  ? ? ?Physical Exam ? ?Gen: Alert, oriented. Appears stated age.  ?HEENT: PERRLA. ?Lungs: No respiratory distress ?CV: RRR ?Abd: soft, benign, no masses ?Ext: No edema ? ? ? ?Planned procedures: Proceed with colonoscopy. The patient understands the nature of the planned procedure, indications, risks, alternatives and potential complications including but not limited to bleeding, infection, perforation, damage to  internal organs and possible oversedation/side effects from anesthesia. The patient agrees and gives consent to proceed.  ?Please refer to procedure notes for findings, recommendations and patient disposition/instructions.  ? ? ? ?Melody Rubenstein, MD ?[facility] ? ? ? ?  ? ?

## 2021-11-27 NOTE — Interval H&P Note (Signed)
History and Physical Interval Note: ? ?11/27/2021 ?1:29 PM ? ?Melody Harding  has presented today for surgery, with the diagnosis of personal history colon polyp.  The various methods of treatment have been discussed with the patient and family. After consideration of risks, benefits and other options for treatment, the patient has consented to  Procedure(s): ?COLONOSCOPY WITH PROPOFOL (N/A) as a surgical intervention.  The patient's history has been reviewed, patient examined, no change in status, stable for surgery.  I have reviewed the patient's chart and labs.  Questions were answered to the patient's satisfaction.   ? ? ?Melody Harding ? ?Ok to proceed with colonoscopy ?

## 2021-11-30 ENCOUNTER — Encounter: Payer: Self-pay | Admitting: Gastroenterology

## 2021-11-30 LAB — SURGICAL PATHOLOGY

## 2021-12-21 ENCOUNTER — Ambulatory Visit: Payer: Medicare PPO | Admitting: Dermatology

## 2021-12-21 DIAGNOSIS — Z1283 Encounter for screening for malignant neoplasm of skin: Secondary | ICD-10-CM | POA: Diagnosis not present

## 2021-12-21 DIAGNOSIS — L814 Other melanin hyperpigmentation: Secondary | ICD-10-CM

## 2021-12-21 DIAGNOSIS — L578 Other skin changes due to chronic exposure to nonionizing radiation: Secondary | ICD-10-CM | POA: Diagnosis not present

## 2021-12-21 DIAGNOSIS — M720 Palmar fascial fibromatosis [Dupuytren]: Secondary | ICD-10-CM

## 2021-12-21 DIAGNOSIS — D1800 Hemangioma unspecified site: Secondary | ICD-10-CM

## 2021-12-21 DIAGNOSIS — Z85828 Personal history of other malignant neoplasm of skin: Secondary | ICD-10-CM

## 2021-12-21 DIAGNOSIS — L821 Other seborrheic keratosis: Secondary | ICD-10-CM

## 2021-12-21 DIAGNOSIS — D229 Melanocytic nevi, unspecified: Secondary | ICD-10-CM

## 2021-12-21 NOTE — Patient Instructions (Signed)

## 2021-12-21 NOTE — Progress Notes (Signed)
   Follow-Up Visit   Subjective  Melody Harding is a 71 y.o. female who presents for the following: Annual Exam (Hx BCC ). The patient presents for Total-Body Skin Exam (TBSE) for skin cancer screening and mole check.  The patient has spots, moles and lesions to be evaluated, some may be new or changing.   The following portions of the chart were reviewed this encounter and updated as appropriate:   Tobacco  Allergies  Meds  Problems  Med Hx  Surg Hx  Fam Hx     Review of Systems:  No other skin or systemic complaints except as noted in HPI or Assessment and Plan.  Objective  Well appearing patient in no apparent distress; mood and affect are within normal limits.  A full examination was performed including scalp, head, eyes, ears, nose, lips, neck, chest, axillae, abdomen, back, buttocks, bilateral upper extremities, bilateral lower extremities, hands, feet, fingers, toes, fingernails, and toenails. All findings within normal limits unless otherwise noted below.   Assessment & Plan  Dupuytren's contracture Hands and feet Continue seeing orthopedic physician if bothersome.  Skin cancer screening  Lentigines - Scattered tan macules - Due to sun exposure - Benign-appearing, observe - Recommend daily broad spectrum sunscreen SPF 30+ to sun-exposed areas, reapply every 2 hours as needed. - Call for any changes  Seborrheic Keratoses - Stuck-on, waxy, tan-brown papules and/or plaques  - Benign-appearing - Discussed benign etiology and prognosis. - Observe - Call for any changes  Melanocytic Nevi - Tan-brown and/or pink-flesh-colored symmetric macules and papules - Benign appearing on exam today - Observation - Call clinic for new or changing moles - Recommend daily use of broad spectrum spf 30+ sunscreen to sun-exposed areas.   Hemangiomas - Red papules - Discussed benign nature - Observe - Call for any changes  Actinic Damage - Chronic condition, secondary to  cumulative UV/sun exposure - diffuse scaly erythematous macules with underlying dyspigmentation - Recommend daily broad spectrum sunscreen SPF 30+ to sun-exposed areas, reapply every 2 hours as needed.  - Staying in the shade or wearing long sleeves, sun glasses (UVA+UVB protection) and wide brim hats (4-inch brim around the entire circumference of the hat) are also recommended for sun protection.  - Call for new or changing lesions.  History of Basal Cell Carcinoma of the Skin - No evidence of recurrence today - Recommend regular full body skin exams - Recommend daily broad spectrum sunscreen SPF 30+ to sun-exposed areas, reapply every 2 hours as needed.  - Call if any new or changing lesions are noted between office visits  Skin cancer screening performed today.  Return in about 1 year (around 12/22/2022) for TBSE.  Luther Redo, CMA, am acting as scribe for Sarina Ser, MD . Documentation: I have reviewed the above documentation for accuracy and completeness, and I agree with the above.  Sarina Ser, MD

## 2022-01-01 ENCOUNTER — Encounter: Payer: Self-pay | Admitting: Dermatology

## 2022-12-22 ENCOUNTER — Encounter: Payer: Self-pay | Admitting: Dermatology

## 2022-12-22 ENCOUNTER — Ambulatory Visit: Payer: Medicare PPO | Admitting: Dermatology

## 2022-12-22 VITALS — BP 121/66 | HR 66

## 2022-12-22 DIAGNOSIS — L821 Other seborrheic keratosis: Secondary | ICD-10-CM | POA: Diagnosis not present

## 2022-12-22 DIAGNOSIS — D229 Melanocytic nevi, unspecified: Secondary | ICD-10-CM

## 2022-12-22 DIAGNOSIS — L814 Other melanin hyperpigmentation: Secondary | ICD-10-CM | POA: Diagnosis not present

## 2022-12-22 DIAGNOSIS — D1801 Hemangioma of skin and subcutaneous tissue: Secondary | ICD-10-CM

## 2022-12-22 DIAGNOSIS — W908XXA Exposure to other nonionizing radiation, initial encounter: Secondary | ICD-10-CM

## 2022-12-22 DIAGNOSIS — Z1283 Encounter for screening for malignant neoplasm of skin: Secondary | ICD-10-CM

## 2022-12-22 DIAGNOSIS — Z85828 Personal history of other malignant neoplasm of skin: Secondary | ICD-10-CM

## 2022-12-22 DIAGNOSIS — X32XXXA Exposure to sunlight, initial encounter: Secondary | ICD-10-CM

## 2022-12-22 DIAGNOSIS — L578 Other skin changes due to chronic exposure to nonionizing radiation: Secondary | ICD-10-CM

## 2022-12-22 DIAGNOSIS — Z79899 Other long term (current) drug therapy: Secondary | ICD-10-CM

## 2022-12-22 DIAGNOSIS — L732 Hidradenitis suppurativa: Secondary | ICD-10-CM

## 2022-12-22 DIAGNOSIS — M720 Palmar fascial fibromatosis [Dupuytren]: Secondary | ICD-10-CM

## 2022-12-22 NOTE — Patient Instructions (Addendum)
     Melanoma ABCDEs  Melanoma is the most dangerous type of skin cancer, and is the leading cause of death from skin disease.  You are more likely to develop melanoma if you: Have light-colored skin, light-colored eyes, or red or blond hair Spend a lot of time in the sun Tan regularly, either outdoors or in a tanning bed Have had blistering sunburns, especially during childhood Have a close family member who has had a melanoma Have atypical moles or large birthmarks  Early detection of melanoma is key since treatment is typically straightforward and cure rates are extremely high if we catch it early.   The first sign of melanoma is often a change in a mole or a new dark spot.  The ABCDE system is a way of remembering the signs of melanoma.  A for asymmetry:  The two halves do not match. B for border:  The edges of the growth are irregular. C for color:  A mixture of colors are present instead of an even brown color. D for diameter:  Melanomas are usually (but not always) greater than 6mm - the size of a pencil eraser. E for evolution:  The spot keeps changing in size, shape, and color.  Please check your skin once per month between visits. You can use a small mirror in front and a large mirror behind you to keep an eye on the back side or your body.   If you see any new or changing lesions before your next follow-up, please call to schedule a visit.  Please continue daily skin protection including broad spectrum sunscreen SPF 30+ to sun-exposed areas, reapplying every 2 hours as needed when you're outdoors.   Staying in the shade or wearing long sleeves, sun glasses (UVA+UVB protection) and wide brim hats (4-inch brim around the entire circumference of the hat) are also recommended for sun protection.    Due to recent changes in healthcare laws, you may see results of your pathology and/or laboratory studies on MyChart before the doctors have had a chance to review them. We  understand that in some cases there may be results that are confusing or concerning to you. Please understand that not all results are received at the same time and often the doctors may need to interpret multiple results in order to provide you with the best plan of care or course of treatment. Therefore, we ask that you please give us 2 business days to thoroughly review all your results before contacting the office for clarification. Should we see a critical lab result, you will be contacted sooner.   If You Need Anything After Your Visit  If you have any questions or concerns for your doctor, please call our main line at [phone] and press option 4 to reach your doctor's medical assistant. If no one answers, please leave a voicemail as directed and we will return your call as soon as possible. Messages left after 4 pm will be answered the following business day.   You may also send us a message via MyChart. We typically respond to MyChart messages within 1-2 business days.  For prescription refills, please ask your pharmacy to contact our office. Our fax number is [phone].  If you have an urgent issue when the clinic is closed that cannot wait until the next business day, you can page your doctor at the number below.    Please note that while we do our best to be available for urgent issues   outside of office hours, we are not available 24/7.   If you have an urgent issue and are unable to reach us, you may choose to seek medical care at your doctor's office, retail clinic, urgent care center, or emergency room.  If you have a medical emergency, please immediately call 911 or go to the emergency department.  Pager Numbers  - Dr. Kowalski: [phone]  - Dr. Moye: [phone]  - Dr. Stewart: [phone]  In the event of inclement weather, please call our main line at [phone] for an update on the status of any delays or closures.  Dermatology Medication Tips: Please  keep the boxes that topical medications come in in order to help keep track of the instructions about where and how to use these. Pharmacies typically print the medication instructions only on the boxes and not directly on the medication tubes.   If your medication is too expensive, please contact our office at [phone] option 4 or send us a message through MyChart.   We are unable to tell what your co-pay for medications will be in advance as this is different depending on your insurance coverage. However, we may be able to find a substitute medication at lower cost or fill out paperwork to get insurance to cover a needed medication.   If a prior authorization is required to get your medication covered by your insurance company, please allow us 1-2 business days to complete this process.  Drug prices often vary depending on where the prescription is filled and some pharmacies may offer cheaper prices.  The website www.goodrx.com contains coupons for medications through different pharmacies. The prices here do not account for what the cost may be with help from insurance (it may be cheaper with your insurance), but the website can give you the price if you did not use any insurance.  - You can print the associated coupon and take it with your prescription to the pharmacy.  - You may also stop by our office during regular business hours and pick up a GoodRx coupon card.  - If you need your prescription sent electronically to a different pharmacy, notify our office through [facility] MyChart or by phone at [phone] option 4.     Si Usted Necesita Algo Despus de Su Visita  Tambin puede enviarnos un mensaje a travs de MyChart. Por lo general respondemos a los mensajes de MyChart en el transcurso de 1 a 2 das hbiles.  Para renovar recetas, por favor pida a su farmacia que se ponga en contacto con nuestra oficina. Nuestro nmero de fax es el [phone].  Si tiene un asunto urgente  cuando la clnica est cerrada y que no puede esperar hasta el siguiente da hbil, puede llamar/localizar a su doctor(a) al nmero que aparece a continuacin.   Por favor, tenga en cuenta que aunque hacemos todo lo posible para estar disponibles para asuntos urgentes fuera del horario de oficina, no estamos disponibles las 24 horas del da, los 7 das de la semana.   Si tiene un problema urgente y no puede comunicarse con nosotros, puede optar por buscar atencin mdica  en el consultorio de su doctor(a), en una clnica privada, en un centro de atencin urgente o en una sala de emergencias.  Si tiene una emergencia mdica, por favor llame inmediatamente al 911 o vaya a la sala de emergencias.  Nmeros de bper  - Dr. Kowalski: [phone]  - Dra. Moye: [phone]  - Dra. Stewart: [phone]  En caso   de inclemencias del tiempo, por favor llame a nuestra lnea principal al [phone] para una actualizacin sobre el estado de cualquier retraso o cierre.  Consejos para la medicacin en dermatologa: Por favor, guarde las cajas en las que vienen los medicamentos de uso tpico para ayudarle a seguir las instrucciones sobre dnde y cmo usarlos. Las farmacias generalmente imprimen las instrucciones del medicamento slo en las cajas y no directamente en los tubos del medicamento.   Si su medicamento es muy caro, por favor, pngase en contacto con nuestra oficina llamando al [phone] y presione la opcin 4 o envenos un mensaje a travs de MyChart.   No podemos decirle cul ser su copago por los medicamentos por adelantado ya que esto es diferente dependiendo de la cobertura de su seguro. Sin embargo, es posible que podamos encontrar un medicamento sustituto a menor costo o llenar un formulario para que el seguro cubra el medicamento que se considera necesario.   Si se requiere una autorizacin previa para que su compaa de seguros cubra su medicamento, por favor permtanos de 1 a 2  das hbiles para completar este proceso.  Los precios de los medicamentos varan con frecuencia dependiendo del lugar de dnde se surte la receta y alguna farmacias pueden ofrecer precios ms baratos.  El sitio web www.goodrx.com tiene cupones para medicamentos de diferentes farmacias. Los precios aqu no tienen en cuenta lo que podra costar con la ayuda del seguro (puede ser ms barato con su seguro), pero el sitio web puede darle el precio si no utiliz ningn seguro.  - Puede imprimir el cupn correspondiente y llevarlo con su receta a la farmacia.  - Tambin puede pasar por nuestra oficina durante el horario de atencin regular y recoger una tarjeta de cupones de GoodRx.  - Si necesita que su receta se enve electrnicamente a una farmacia diferente, informe a nuestra oficina a travs de MyChart de [facility] o por telfono llamando al [phone] y presione la opcin 4.  

## 2022-12-22 NOTE — Progress Notes (Signed)
Follow-Up Visit   Subjective  Melody Harding is a 72 y.o. female who presents for the following: Skin Cancer Screening and Full Body Skin Exam Hx of bcc, hx of HS, no active flares staying clear. Has doxycycline and mupirocin on hand as needed for flares.  The patient presents for Total-Body Skin Exam (TBSE) for skin cancer screening and mole check. The patient has spots, moles and lesions to be evaluated, some may be new or changing and the patient has concerns that these could be cancer.    The following portions of the chart were reviewed this encounter and updated as appropriate: medications, allergies, medical history  Review of Systems:  No other skin or systemic complaints except as noted in HPI or Assessment and Plan.  Objective  Well appearing patient in no apparent distress; mood and affect are within normal limits.  A full examination was performed including scalp, head, eyes, ears, nose, lips, neck, chest, axillae, abdomen, back, buttocks, bilateral upper extremities, bilateral lower extremities, hands, feet, fingers, toes, fingernails, and toenails. All findings within normal limits unless otherwise noted below.   Relevant physical exam findings are noted in the Assessment and Plan.    Assessment & Plan   LENTIGINES, SEBORRHEIC KERATOSES, HEMANGIOMAS - Benign normal skin lesions - Benign-appearing - Call for any changes  Dupuytren's contracture Hands and feet Continue seeing orthopedic physician if bothersome.  Hidradenitis suppurativa Groin   Clear at exam  Chronic condition with duration or expected duration over one year. Currently well-controlled.   Hx of inflamed cysts in groin region - Hidradenitis Suppurativa is a chronic; persistent; non-curable, but treatable condition due to abnormal inflamed sweat glands in the body folds (axilla, inframammary, groin, medial thighs), causing recurrent painful cysts and scarring. It can be associated with severe  scarring acne and cysts; abscesses and scarring of scalp. The goal is control and prevention of flares, as it is not curable. Scars are permanent and can be thickened. Treatment may include daily use of topical medication and oral antibiotics.  Oral isotretinoin may also be helpful.  For more severe cases, Humira (a biologic injection) may be prescribed to decrease the inflammatory process and prevent flares.  When indicated, inflamed cysts may also be treated surgically.    Continue as needed for flares Mupirocin 2% ointment to aa's BID and   As needed for flares. Patient will call if she needs refills Doxycycline 100mg  po BID x 1 week during flares.   Doxycycline should be taken with food to prevent nausea. Do not lay down for 30 minutes after taking. Be cautious with sun exposure and use good sun protection while on this medication. Pregnant women should not take this medication.    MELANOCYTIC NEVI - Tan-brown and/or pink-flesh-colored symmetric macules and papules - Benign appearing on exam today - Observation - Call clinic for new or changing moles - Recommend daily use of broad spectrum spf 30+ sunscreen to sun-exposed areas.   ACTINIC DAMAGE - Chronic condition, secondary to cumulative UV/sun exposure - diffuse scaly erythematous macules with underlying dyspigmentation - Recommend daily broad spectrum sunscreen SPF 30+ to sun-exposed areas, reapply every 2 hours as needed.  - Staying in the shade or wearing long sleeves, sun glasses (UVA+UVB protection) and wide brim hats (4-inch brim around the entire circumference of the hat) are also recommended for sun protection.  - Call for new or changing lesions.  HISTORY OF BASAL CELL CARCINOMA OF THE SKIN Multiple areas see history  - No  evidence of recurrence today - Recommend regular full body skin exams - Recommend daily broad spectrum sunscreen SPF 30+ to sun-exposed areas, reapply every 2 hours as needed.  - Call if any new or  changing lesions are noted between office visits  SKIN CANCER SCREENING PERFORMED TODAY.     Return in about 1 year (around 12/22/2023) for TBSE.  IAsher Muir, CMA, am acting as scribe for Armida Sans, MD.   Documentation: I have reviewed the above documentation for accuracy and completeness, and I agree with the above.  Armida Sans, MD

## 2022-12-29 ENCOUNTER — Encounter: Payer: Self-pay | Admitting: Dermatology

## 2023-07-11 DIAGNOSIS — M7062 Trochanteric bursitis, left hip: Secondary | ICD-10-CM | POA: Insufficient documentation

## 2023-07-27 ENCOUNTER — Encounter: Payer: Self-pay | Admitting: Dermatology

## 2023-07-27 ENCOUNTER — Ambulatory Visit: Payer: Medicare PPO | Admitting: Dermatology

## 2023-07-27 DIAGNOSIS — D045 Carcinoma in situ of skin of trunk: Secondary | ICD-10-CM

## 2023-07-27 DIAGNOSIS — D485 Neoplasm of uncertain behavior of skin: Secondary | ICD-10-CM

## 2023-07-27 DIAGNOSIS — C4492 Squamous cell carcinoma of skin, unspecified: Secondary | ICD-10-CM

## 2023-07-27 DIAGNOSIS — D492 Neoplasm of unspecified behavior of bone, soft tissue, and skin: Secondary | ICD-10-CM

## 2023-07-27 DIAGNOSIS — L820 Inflamed seborrheic keratosis: Secondary | ICD-10-CM

## 2023-07-27 HISTORY — DX: Squamous cell carcinoma of skin, unspecified: C44.92

## 2023-07-27 NOTE — Progress Notes (Signed)
   Follow-Up Visit   Subjective  Melody Harding is a 72 y.o. female who presents for the following: spot at back that is bleeding. Patient said she did have a fall but is not sure if that's what started the bleeding about 1 week ago. Her bra does rub against it.    The patient has spots, moles and lesions to be evaluated, some may be new or changing and the patient may have concern these could be cancer.   The following portions of the chart were reviewed this encounter and updated as appropriate: medications, allergies, medical history  Review of Systems:  No other skin or systemic complaints except as noted in HPI or Assessment and Plan.  Objective  Well appearing patient in no apparent distress; mood and affect are within normal limits.   A focused examination was performed of the following areas: back  Relevant exam findings are noted in the Assessment and Plan.  mid paraspinal back 9 mm keratotic papule ISK vs SCC   L postauricular x 1, L superior forehead x 3 (4) Erythematous stuck-on, waxy papule or plaque  Assessment & Plan     NEOPLASM OF UNCERTAIN BEHAVIOR OF SKIN mid paraspinal back Skin / nail biopsy Type of biopsy: tangential   Informed consent: discussed and consent obtained   Timeout: patient name, date of birth, surgical site, and procedure verified   Procedure prep:  Patient was prepped and draped in usual sterile fashion Prep type:  Isopropyl alcohol Anesthesia: the lesion was anesthetized in a standard fashion   Anesthetic:  1% lidocaine w/ epinephrine 1-100,000 buffered w/ 8.4% NaHCO3 Instrument used: DermaBlade   Hemostasis achieved with: pressure and aluminum chloride   Outcome: patient tolerated procedure well   Post-procedure details: sterile dressing applied and wound care instructions given   Dressing type: bandage and petrolatum   Specimen 1 - Surgical pathology Differential Diagnosis: ISK vs SCC  Check Margins: No 9 mm keratotic  papule  INFLAMED SEBORRHEIC KERATOSIS (4) L postauricular x 1, L superior forehead x 3 (4) Symptomatic, irritating, patient would like treated.  Benign-appearing.  Call clinic for new or changing lesions.   Destruction of lesion - L postauricular x 1, L superior forehead x 3 (4) Complexity: simple   Destruction method: cryotherapy   Informed consent: discussed and consent obtained   Timeout:  patient name, date of birth, surgical site, and procedure verified Lesion destroyed using liquid nitrogen: Yes   Region frozen until ice ball extended beyond lesion: Yes   Cryo cycles: 1 or 2. Outcome: patient tolerated procedure well with no complications   Post-procedure details: wound care instructions given    Return for TBSE, as scheduled, with Dr. Kirtland Bouchard, Hx BCC.  Anise Salvo, RMA, am acting as scribe for Elie Goody, MD .   Documentation: I have reviewed the above documentation for accuracy and completeness, and I agree with the above.  Elie Goody, MD

## 2023-07-27 NOTE — Patient Instructions (Signed)

## 2023-07-30 ENCOUNTER — Encounter: Payer: Self-pay | Admitting: Dermatology

## 2023-07-30 DIAGNOSIS — K219 Gastro-esophageal reflux disease without esophagitis: Secondary | ICD-10-CM | POA: Insufficient documentation

## 2023-07-30 DIAGNOSIS — E7800 Pure hypercholesterolemia, unspecified: Secondary | ICD-10-CM | POA: Insufficient documentation

## 2023-08-01 LAB — SURGICAL PATHOLOGY

## 2023-08-08 ENCOUNTER — Telehealth: Payer: Self-pay

## 2023-08-08 NOTE — Telephone Encounter (Signed)
Advised pt of bx results.  Scheduled pt for EDC/sh 

## 2023-08-08 NOTE — Telephone Encounter (Signed)
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   Explanation: This is a squamous cell skin cancer limited to the top layer of skin. This means it is an early cancer and has not spread. However, it has the potential to spread beyond the skin and threaten your health, so we recommend treating it.   Treatment: you return for a brief appointment where I perform electrodesiccation and curettage [facility]). This involves three rounds of scraping and burning to destroy the skin cancer. It has about an 85% cure rate and leaves a round wound slightly larger than the skin cancer and leaves a round white scar. No additional pathology is done. If the skin cancer comes back, we would need to do a surgery to remove it.

## 2023-08-25 ENCOUNTER — Encounter: Payer: Self-pay | Admitting: Dermatology

## 2023-08-25 ENCOUNTER — Ambulatory Visit: Payer: Medicare PPO | Admitting: Dermatology

## 2023-08-25 DIAGNOSIS — D045 Carcinoma in situ of skin of trunk: Secondary | ICD-10-CM | POA: Diagnosis not present

## 2023-08-25 NOTE — Progress Notes (Signed)
   Follow-Up Visit   Subjective  Melody Harding is a 73 y.o. female who presents for the following: Here for [facility] of SCCis at mid paraspinal back. Bx: 07/27/2023  The patient has spots, moles and lesions to be evaluated, some may be new or changing and the patient may have concern these could be cancer.    The following portions of the chart were reviewed this encounter and updated as appropriate: medications, allergies, medical history  Review of Systems:  No other skin or systemic complaints except as noted in HPI or Assessment and Plan.  Objective  Well appearing patient in no apparent distress; mood and affect are within normal limits.  A focused examination was performed of the following areas: back Relevant physical exam findings are noted in the Assessment and Plan.  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 Mid Back Left Paraspinal Destruction of lesion Complexity: simple   Destruction method: electrodesiccation and curettage   Informed consent: discussed and consent obtained   Timeout:  patient name, date of birth, surgical site, and procedure verified Procedure prep:  Patient was prepped and draped in usual sterile fashion Prep type:  Isopropyl alcohol Anesthesia: the lesion was anesthetized in a standard fashion   Anesthetic:  1% lidocaine w/ epinephrine 1-100,000 local infiltration Curettage performed in three different directions: Yes   Electrodesiccation performed over the curetted area: Yes   Curettage cycles:  3 Final wound size (cm):  1.2 Hemostasis achieved with:  aluminum chloride and electrodesiccation Outcome: patient tolerated procedure well with no complications   Post-procedure details: sterile dressing applied and wound care instructions given   Dressing type: bandage and petrolatum     Return for TBSE As Scheduled, With Dr. Gwen Pounds.  I, Lawson Radar, CMA, am acting  as scribe for Elie Goody, MD.   Documentation: I have reviewed the above documentation for accuracy and completeness, and I agree with the above.  Elie Goody, MD

## 2023-08-25 NOTE — Patient Instructions (Signed)
Wound Care Instructions  Cleanse wound gently with soap and water once a day then pat dry with clean gauze. Apply a thin coat of Petrolatum (petroleum jelly, "Vaseline") over the wound (unless you have an allergy to this). We recommend that you use a new, sterile tube of Vaseline. Do not pick or remove scabs. Do not remove the yellow or white "healing tissue" from the base of the wound.  Cover the wound with fresh, clean, nonstick gauze and secure with paper tape. You may use Band-Aids in place of gauze and tape if the wound is small enough, but would recommend trimming much of the tape off as there is often too much. Sometimes Band-Aids can irritate the skin.  You should call the office for your biopsy report after 1 week if you have not already been contacted.  If you experience any problems, such as abnormal amounts of bleeding, swelling, significant bruising, significant pain, or evidence of infection, please call the office immediately.  FOR ADULT SURGERY PATIENTS: If you need something for pain relief you may take 1 extra strength Tylenol (acetaminophen) AND 2 Ibuprofen (200mg  each) together every 4 hours as needed for pain. (do not take these if you are allergic to them or if you have a reason you should not take them.) Typically, you may only need pain medication for 1 to 3 days.    Recommend daily broad spectrum sunscreen SPF 30+ to sun-exposed areas, reapply every 2 hours as needed. Call for new or changing lesions.  Staying in the shade or wearing long sleeves, sun glasses (UVA+UVB protection) and wide brim hats (4-inch brim around the entire circumference of the hat) are also recommended for sun protection.    Due to recent changes in healthcare laws, you may see results of your pathology and/or laboratory studies on MyChart before the doctors have had a chance to review them. We understand that in some cases there may be results that are confusing or concerning to you. Please understand  that not all results are received at the same time and often the doctors may need to interpret multiple results in order to provide you with the best plan of care or course of treatment. Therefore, we ask that you please give [place] 2 business days to thoroughly review all your results before contacting the office for clarification. Should we see a critical lab result, you will be contacted sooner.   If You Need Anything After Your Visit  If you have any questions or concerns for your doctor, please call our main line at [phone] and press option 4 to reach your doctor's medical assistant. If no one answers, please leave a voicemail as directed and we will return your call as soon as possible. Messages left after 4 pm will be answered the following business day.   You may also send [place] a message via MyChart. We typically respond to MyChart messages within 1-2 business days.  For prescription refills, please ask your pharmacy to contact our office. Our fax number is [phone].  If you have an urgent issue when the clinic is closed that cannot wait until the next business day, you can page your doctor at the number below.    Please note that while we do our best to be available for urgent issues outside of office hours, we are not available 24/7.   If you have an urgent issue and are unable to reach [place], you may choose to seek medical care at your doctor's office,  retail clinic, urgent care center, or emergency room.  If you have a medical emergency, please immediately call 911 or go to the emergency department.  Pager Numbers  - Dr. Gwen Pounds: [phone]  - Dr. Roseanne Reno: [phone]  - Dr. Katrinka Blazing: [phone]   In the event of inclement weather, please call our main line at [phone] for an update on the status of any delays or closures.  Dermatology Medication Tips: Please keep the boxes that topical medications come in in order to help keep track of the instructions about where  and how to use these. Pharmacies typically print the medication instructions only on the boxes and not directly on the medication tubes.   If your medication is too expensive, please contact our office at [phone] option 4 or send [place] a message through MyChart.   We are unable to tell what your co-pay for medications will be in advance as this is different depending on your insurance coverage. However, we may be able to find a substitute medication at lower cost or fill out paperwork to get insurance to cover a needed medication.   If a prior authorization is required to get your medication covered by your insurance company, please allow [place] 1-2 business days to complete this process.  Drug prices often vary depending on where the prescription is filled and some pharmacies may offer cheaper prices.  The website www.goodrx.com contains coupons for medications through different pharmacies. The prices here do not account for what the cost may be with help from insurance (it may be cheaper with your insurance), but the website can give you the price if you did not use any insurance.  - You can print the associated coupon and take it with your prescription to the pharmacy.  - You may also stop by our office during regular business hours and pick up a GoodRx coupon card.  - If you need your prescription sent electronically to a different pharmacy, notify our office through [facility] or by phone at [phone] option 4.     Si Usted Necesita Algo Despus de Su Visita  Tambin puede enviarnos un mensaje a travs de Clinical cytogeneticist. Por lo general respondemos a los mensajes de MyChart en el transcurso de 1 a 2 das hbiles.  Para renovar recetas, por favor pida a su farmacia que se ponga en contacto con nuestra oficina. Annie Sable de fax es [place] [phone].  Si tiene un asunto urgente cuando la clnica est cerrada y que no puede esperar hasta el siguiente da hbil, puede llamar/localizar a  su doctor(a) al nmero que aparece a continuacin.   Por favor, tenga en cuenta que aunque hacemos todo lo posible para estar disponibles para asuntos urgentes fuera del horario de [place], no estamos disponibles las 24 horas del da, los 7 [address]  [address] de la [place].   Si tiene un problema urgente y no puede comunicarse con nosotros, puede optar por buscar atencin mdica  en el consultorio de su doctor(a), en una clnica privada, en un centro de atencin urgente o en una sala de emergencias.  Si tiene Engineer, drilling, por favor llame inmediatamente al 911 o vaya a la sala de emergencias.  Nmeros de bper  - Dr. Gwen Pounds: [phone]  - Dra. Roseanne Reno: [phone]  - Dr. Katrinka Blazing: [phone]   En caso de inclemencias del tiempo, por favor llame a Lacy Duverney principal al [phone] para una actualizacin sobre el [place] de cualquier retraso o cierre.  Consejos para la medicacin en dermatologa: Por  favor, guarde las cajas en las que vienen los medicamentos de uso tpico para ayudarle a seguir las instrucciones sobre dnde y cmo usarlos. Las farmacias generalmente imprimen las instrucciones del medicamento slo en las cajas y no directamente en los tubos del [place].   Si su medicamento es muy caro, por favor, pngase en contacto con Rolm [place]ala llamando al [phone] y presione la opcin 4 o envenos un mensaje a travs de Clinical cytogeneticist.   No podemos decirle cul ser su copago por los medicamentos por adelantado ya que esto es diferente dependiendo de la cobertura de su seguro. Sin embargo, es posible que podamos encontrar un medicamento sustituto a Audiological scientist un formulario para que el seguro cubra el medicamento que se considera necesario.   Si se requiere una autorizacin previa para que su compaa de seguros [place] su medicamento, por favor permtanos de 1 a 2 das hbiles para completar [address]  Los precios de los medicamentos varan con frecuencia dependiendo  del Environmental consultant de dnde se surte la receta y alguna farmacias pueden ofrecer precios ms baratos.  El sitio web www.goodrx.com tiene cupones para medicamentos de Health and safety inspector. Los precios aqu no tienen en cuenta lo que podra costar con la ayuda del seguro (puede ser ms barato con su seguro), pero el sitio web puede darle el precio si no utiliz Tourist information centre manager.  - Puede imprimir el cupn correspondiente y llevarlo con su receta a la farmacia.  - Tambin puede pasar por nuestra oficina durante el horario de atencin regular y Education officer, museum una tarjeta de cupones de GoodRx.  - Si necesita que su receta se enve electrnicamente a una farmacia diferente, informe a nuestra oficina a travs de MyChart de [place] o por telfono llamando al [phone] y presione la opcin 4.

## 2023-10-31 ENCOUNTER — Encounter: Payer: Self-pay | Admitting: Dermatology

## 2023-10-31 ENCOUNTER — Ambulatory Visit: Admitting: Dermatology

## 2023-10-31 DIAGNOSIS — L814 Other melanin hyperpigmentation: Secondary | ICD-10-CM

## 2023-10-31 DIAGNOSIS — L821 Other seborrheic keratosis: Secondary | ICD-10-CM

## 2023-10-31 DIAGNOSIS — L578 Other skin changes due to chronic exposure to nonionizing radiation: Secondary | ICD-10-CM

## 2023-10-31 DIAGNOSIS — C4492 Squamous cell carcinoma of skin, unspecified: Secondary | ICD-10-CM

## 2023-10-31 DIAGNOSIS — D492 Neoplasm of unspecified behavior of bone, soft tissue, and skin: Secondary | ICD-10-CM | POA: Diagnosis not present

## 2023-10-31 DIAGNOSIS — C44329 Squamous cell carcinoma of skin of other parts of face: Secondary | ICD-10-CM

## 2023-10-31 DIAGNOSIS — W908XXA Exposure to other nonionizing radiation, initial encounter: Secondary | ICD-10-CM

## 2023-10-31 DIAGNOSIS — L820 Inflamed seborrheic keratosis: Secondary | ICD-10-CM | POA: Diagnosis not present

## 2023-10-31 HISTORY — DX: Squamous cell carcinoma of skin, unspecified: C44.92

## 2023-10-31 NOTE — Progress Notes (Signed)
   Follow-Up Visit   Subjective  Melody Harding is a 73 y.o. female who presents for the following: The patient has a spot on the right side of her face to be evaluated, some may be new or changing and the patient may have concern these could be cancer.  The following portions of the chart were reviewed this encounter and updated as appropriate: medications, allergies, medical history  Review of Systems:  No other skin or systemic complaints except as noted in HPI or Assessment and Plan.  Objective  Well appearing patient in no apparent distress; mood and affect are within normal limits.  A focused examination was performed of the following areas:face  Relevant exam findings are noted in the Assessment and Plan.       right side burn preauricular 1.1 cm crusted papule face x 20 (20) Stuck-on, waxy, tan-brown papules and plaques -- Discussed benign etiology and prognosis.   Assessment & Plan   SEBORRHEIC KERATOSIS - Stuck-on, waxy, tan-brown papules and/or plaques  - Benign-appearing - Discussed benign etiology and prognosis. - Observe - Call for any changes  ACTINIC DAMAGE - chronic, secondary to cumulative UV radiation exposure/sun exposure over time - diffuse scaly erythematous macules with underlying dyspigmentation - Recommend daily broad spectrum sunscreen SPF 30+ to sun-exposed areas, reapply every 2 hours as needed.  - Recommend staying in the shade or wearing long sleeves, sun glasses (UVA+UVB protection) and wide brim hats (4-inch brim around the entire circumference of the hat). - Call for new or changing lesions.    LENTIGINES Exam: scattered tan macules Due to sun exposure Treatment Plan: Benign-appearing, observe. Recommend daily broad spectrum sunscreen SPF 30+ to sun-exposed areas, reapply every 2 hours as needed.  Call for any changes  NEOPLASM OF SKIN right side burn preauricular Epidermal / dermal shaving  Lesion diameter (cm):  1.1 Informed  consent: discussed and consent obtained   Timeout: patient name, date of birth, surgical site, and procedure verified   Procedure prep:  Patient was prepped and draped in usual sterile fashion Prep type:  Isopropyl alcohol Anesthesia: the lesion was anesthetized in a standard fashion   Anesthetic:  1% lidocaine w/ epinephrine 1-100,000 buffered w/ 8.4% NaHCO3 Instrument used: flexible razor blade   Hemostasis achieved with: pressure, aluminum chloride and electrodesiccation   Outcome: patient tolerated procedure well   Post-procedure details: sterile dressing applied and wound care instructions given   Dressing type: bandage and petrolatum   Additional details:  Mupirocin and a bandage applied Specimen 1 - Surgical pathology Differential Diagnosis: R/O ISK vs SCC  Check Margins: No INFLAMED SEBORRHEIC KERATOSIS (20) face x 20 (20) Symptomatic, irritating, patient would like treated.  Destruction of lesion - face x 20 (20) Complexity: simple   Destruction method: cryotherapy   Informed consent: discussed and consent obtained   Timeout:  patient name, date of birth, surgical site, and procedure verified Lesion destroyed using liquid nitrogen: Yes   Region frozen until ice ball extended beyond lesion: Yes   Outcome: patient tolerated procedure well with no complications   Post-procedure details: wound care instructions given   ACTINIC SKIN DAMAGE   LENTIGO   SEBORRHEIC KERATOSIS    Return for scheduled appt in 2 months .  IAngelique Holm, CMA, am acting as scribe for Armida Sans, MD .   Documentation: I have reviewed the above documentation for accuracy and completeness, and I agree with the above.  Armida Sans, MD

## 2023-10-31 NOTE — Patient Instructions (Addendum)

## 2023-11-02 LAB — SURGICAL PATHOLOGY

## 2023-11-03 ENCOUNTER — Telehealth: Payer: Self-pay

## 2023-11-03 NOTE — Telephone Encounter (Signed)
-----   Message from Armida Sans sent at 11/02/2023  6:36 PM EDT ----- FINAL DIAGNOSIS        1. 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 Schedule for [facility]

## 2023-11-03 NOTE — Telephone Encounter (Signed)
 Called patient discussed biopsy results   1. Skin, right side burn preauricular :       WELL DIFFERENTIATED SQUAMOUS CELL CARCINOMA   Cancer = SCC  Well differentiated  Needs treated.  Scheduled patient for [facility]

## 2023-12-14 ENCOUNTER — Ambulatory Visit: Admitting: Urology

## 2023-12-14 VITALS — BP 148/82 | HR 79 | Ht 60.0 in | Wt 157.0 lb

## 2023-12-14 DIAGNOSIS — K5909 Other constipation: Secondary | ICD-10-CM

## 2023-12-14 DIAGNOSIS — Z853 Personal history of malignant neoplasm of breast: Secondary | ICD-10-CM

## 2023-12-14 DIAGNOSIS — N390 Urinary tract infection, site not specified: Secondary | ICD-10-CM

## 2023-12-14 LAB — URINALYSIS, COMPLETE
Bilirubin, UA: NEGATIVE
Glucose, UA: NEGATIVE
Ketones, UA: NEGATIVE
Nitrite, UA: NEGATIVE
Protein,UA: NEGATIVE
Specific Gravity, UA: 1.02 (ref 1.005–1.030)
Urobilinogen, Ur: 0.2 mg/dL (ref 0.2–1.0)
pH, UA: 6 (ref 5.0–7.5)

## 2023-12-14 LAB — MICROSCOPIC EXAMINATION: WBC, UA: >30 /HPF — AB (ref 0–5)

## 2023-12-14 LAB — BLADDER SCAN AMB NON-IMAGING

## 2023-12-14 MED ORDER — ESTRADIOL 0.1 MG/GM VA CREA: TOPICAL_CREAM | VAGINAL | 12 refills | Status: AC

## 2023-12-14 NOTE — Progress Notes (Signed)
 Elfrieda Grise Plume,acting as a scribe for Dustin Gimenez, MD.,have documented all relevant documentation on the behalf of Dustin Gimenez, MD,as directed by  Dustin Gimenez, MD while in the presence of Dustin Gimenez, MD.  12/14/23 1:00 PM   Melody Harding [birth year] [id number]  Referring provider: Monique Ano, MD [phone] [facility] [address] [facility] [place],  [place] [address]  Chief Complaint  Patient presents with   New Patient (Initial Visit)    HPI: 73 year old female with a personal history of right breast cancer presents today for further evaluation of recurrent UTIs. She was referred by Dr. Euell Herrlich. She reports a history of UTIs, but notes that since November, she has been experiencing recurrent infections, with symptoms reappearing two days after completing antibiotic treatment.   She has had three positive urine cultures on 11/24, 3/25, and 3/31, all growing E. coli. She does not have any upper tract imaging for review.   Her urinalysis today >30 WBC and 3-10 RBC.   She denies any kidney problems or history of kidney stones.   She also reports chronic constipation, typically having bowel movements twice a week, and recently experienced severe constipation which was relieved with a stool softener and laxative.   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 She has been cancer-free for about eight years.   She is postmenopausal and has experienced vaginal dryness, which may contribute to her recurrent UTIs.   She is not currently taking tamoxifen .   She is scheduled for back surgery in a few weeks due to a disc fragment pressing on a nerve.  Results for orders placed or performed in visit on 12/14/23  Microscopic Examination   Urine  Result Value Ref Range   WBC, UA >30 (A) 0 - 5 /hpf   RBC, Urine 3-10 (A) 0 - 2 /hpf   Epithelial Cells (non renal) 0-10 0 - 10 /hpf    Casts Present (A) None seen /lpf   Cast Type Hyaline casts N/A   Mucus, UA Present (A) Not Estab.   Bacteria, UA Few None seen/Few  Urinalysis, Complete  Result Value Ref Range   Specific Gravity, UA 1.020 1.005 - 1.030   pH, UA 6.0 5.0 - 7.5   Color, UA Yellow Yellow   Appearance Ur Clear Clear   Leukocytes,UA 2+ (A) Negative   Protein,UA Negative Negative/Trace   Glucose, UA Negative Negative   Ketones, UA Negative Negative   RBC, UA 1+ (A) Negative   Bilirubin, UA Negative Negative   Urobilinogen, Ur 0.2 0.2 - 1.0 mg/dL   Nitrite, UA Negative Negative   Microscopic Examination See below:   BLADDER SCAN AMB NON-IMAGING  Result Value Ref Range   Scan Result 52ml      PMH: Past Medical History:  Diagnosis Date   Basal cell carcinoma 08/15/2018   right chest infraclavicular   Basal cell carcinoma 12/29/2016   left mid lat forearm   Basal cell carcinoma 05/10/2016   right distal pretibial   Basal cell carcinoma 02/05/2016   left mid pretibial, left mid distal pretibial, left mid distal pretibial above ankle   Basal cell carcinoma 11/17/2015   right mid lat pretibial sup, right mid lat pretibial inf   Breast cancer (HCC)    Cancer of central portion of female breast, right 10/10/2015   Depression    GERD (gastroesophageal reflux disease)    H/O bone  density study 1993   H/O colonoscopy 2012   Hyperlipemia    Osteopenia    of neck and left femur   SCC (squamous cell carcinoma) 07/27/2023   SCC IS, mid back left paraspinal . 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   right side burn preauricular, need EDC   Wears glasses    Wears hearing aid    bilateral    Surgical History: Past Surgical History:  Procedure Laterality Date   ABDOMINAL HYSTERECTOMY     part   BREAST DUCTAL SYSTEM EXCISION Left 06/20/2014   Procedure: LEFT BREAST CENTRAL DUCT EXCISION;  Surgeon: Sim Dryer, MD;  Location: [facility];  Service: General;  Laterality: Left;   BREAST LUMPECTOMY WITH RADIOACTIVE SEED LOCALIZATION Right 10/22/2015   Procedure: BREAST LUMPECTOMY WITH RADIOACTIVE SEED LOCALIZATION;  Surgeon: Enid Harry, MD;  Location: [facility];  Service: General;  Laterality: Right;   CATARACT EXTRACTION W/PHACO Right 10/05/2017   Procedure: CATARACT EXTRACTION PHACO AND INTRAOCULAR LENS PLACEMENT (IOC) RIGHT;  Surgeon: Annell Kidney, MD;  Location: [facility];  Service: Ophthalmology;  Laterality: Right;   CATARACT EXTRACTION W/PHACO Left 11/02/2017   Procedure: CATARACT EXTRACTION PHACO AND INTRAOCULAR LENS PLACEMENT (IOC) LEFT;  Surgeon: Annell Kidney, MD;  Location: [facility];  Service: Ophthalmology;  Laterality: Left;  PATIENT WANTS EARLY ARRIVAL TIME   COLONOSCOPY     COLONOSCOPY WITH PROPOFOL  N/A 11/27/2021   Procedure: COLONOSCOPY WITH PROPOFOL ;  Surgeon: Shane Darling, MD;  Location: [facility];  Service: Endoscopy;  Laterality: N/A;   FOOT SURGERY     HAND SURGERY     TONSILLECTOMY     TUBAL LIGATION     WISDOM TOOTH EXTRACTION      Home Medications:  Allergies as of 12/14/2023       Reactions   Fosamax  [alendronate ] Other (See Comments)        Medication List        Accurate as of Dec 14, 2023  1:00 PM. If you have any questions, ask your nurse or doctor.          acetaminophen  500 MG tablet Commonly known as: TYLENOL  Take 500 mg by mouth as needed. Reported on 11/18/2015   BACILLUS COAGULANS-INULIN PO Take by mouth.   cholecalciferol 25 MCG (1000 UNIT) tablet Commonly known as: VITAMIN D3 Take 1 tablet (1,000 Units total) by mouth daily.   docusate sodium 100 MG capsule Commonly known as: COLACE Take 100 mg by mouth daily. Takes 2 capsules at bedtime, generic brand   doxycycline  100 MG tablet Commonly known as: VIBRA -TABS TAKE ONE TAB BY MOUTH TWICE A DAY X 1 WEEK AS NEEDED FOR FLARES. TAKE WITH FOOD.    escitalopram 20 MG tablet Commonly known as: LEXAPRO Take 20 mg by mouth daily.   esomeprazole 40 MG capsule Commonly known as: NEXIUM Take 40 mg by mouth daily at 12 noon.   estradiol  0.1 MG/GM vaginal cream Commonly known as: ESTRACE  Estrogen Cream Instruction Discard applicator Apply pea sized amount to tip of finger to urethra before bed. Wash hands well after application. Use Monday, Wednesday and Friday Started by: Dustin Gimenez   fluticasone 50 MCG/ACT nasal spray Commonly known as: FLONASE Place into both nostrils daily as needed. Reported on 10/15/2015   ibuprofen 200 MG tablet Commonly known as: ADVIL Take 200 mg by mouth as needed. Reported on 02/26/2016   montelukast 10 MG tablet  Commonly known as: SINGULAIR Take 10 mg by mouth at bedtime.   mupirocin  ointment 2 % Commonly known as: BACTROBAN  Apply to aa's BID PRN flares.   rosuvastatin 20 MG tablet Commonly known as: CRESTOR Take 20 mg by mouth daily.        Allergies:  Allergies  Allergen Reactions   Fosamax  [Alendronate ] Other (See Comments)    Family History: Family History  Problem Relation Age of Onset   Pancreatic cancer Mother 38   Liver cancer Mother    Breast cancer Maternal Aunt 67   Breast cancer Paternal Aunt 35    Social History:  reports that she has never smoked. She has never used smokeless tobacco. She reports current alcohol use of about 2.0 standard drinks of alcohol per week. She reports that she does not use drugs.   Physical Exam: BP (!) 148/82   Pulse 79   Ht 5' (1.524 m)   Wt 157 lb (71.2 kg)   BMI 30.66 kg/m   Constitutional:  Alert and oriented, No acute distress. HEENT: [place] AT, moist mucus membranes.  Trachea midline, no masses. Neurologic: Grossly intact, no focal deficits, moving all 4 extremities. Psychiatric: Normal mood and affect.   Assessment & Plan:    1. Recurrent Urinary Tract Infections - She has a history of recurrent UTIs with three positive  urine cultures for E. coli.  - She reports symptoms of burning after urination and foul-smelling urine.  - She is postmenopausal, which increases her risk for UTIs.  - Start on a topical estrogen cream to address vaginal and urethral dryness, which may contribute to recurrent infections. She is advised to apply the cream three times a week, with an option to use it daily for two weeks initially.  - Supplements such as Cranberry Tablets, Probiotics, and D-mannose are recommended.  - Her urine will be sent for culture, but treatment will only be initiated if symptoms are present.  - Follow-up is advised if infections persist, with potential further evaluation including imaging or cystoscopy.  - Recurrent UTI Prevention Strategies  Stay well hydrated. Get a moderate amount of exercise. Eat a diet rich in fruit and vegetables. Start a bowel regimen to manage your constipation. Your goal is to have consistent, formed bowel movements that are easy for you to pass. You may use either of the over-the-counter supplements Benefiber or Miralax to help with this. I recommend that you try Benefiber first and move on to Miralax if this is not helping you enough. You may adjust the recommended dose of Miralax (one capful daily) to achieve this goal. Start taking an over-the-counter cranberry supplement for urinary tract health. Take this once or twice daily on an empty stomach, e.g. right before bed. Start taking an over-the-counter d-mannose supplement. Take this daily per packaging instructions. Start taking an over-the-counter probiotic containing the bacterial species called Lactobacillus. Take this daily. Start vaginal estrogen cream. Apply a pea-sized amount around the opening of the urethra every day for 2 weeks, then three times weekly forever.   2. Constipation - She reports chronic constipation, typically having bowel movements twice a week.  - She is advised to use a daily maintenance stool  softener like Colace and to ensure adequate hydration. Miralax is recommended as needed for its osmotic effect to soften stools without causing cramping.  - She is encouraged to avoid regular use of laxatives to prevent dependency.  3. History of Breast Cancer (DCIS) - She has a history of DCIS treated  with lumpectomy, radiation, and tamoxifen . She is currently cancer-free for eight years.  - The use of topical estrogen cream is discussed,discussed its safety and minimal systemic absorption.  She understands the theoretical risk which is quite low in the absence of recurrence as well as her low risk breast cancer.  With shared decision making, she feels comfortable with this plan.    Return if symptoms worsen or fail to improve.  I have reviewed the above documentation for accuracy and completeness, and I agree with the above.   Dustin Gimenez, MD   [facility] [address] [place], [place] [address] [phone]

## 2023-12-14 NOTE — Patient Instructions (Signed)
For UTI prevention take cranberry tablets, probiotic, D-Mannose daily.  

## 2023-12-22 ENCOUNTER — Ambulatory Visit: Payer: Medicare PPO | Admitting: Dermatology

## 2024-01-04 ENCOUNTER — Telehealth: Payer: Self-pay

## 2024-01-04 NOTE — Telephone Encounter (Signed)
 Patient was advised today that she has a UTI.  She complains of odor and pressure.  Appointment scheduled.

## 2024-01-05 ENCOUNTER — Encounter: Payer: Self-pay | Admitting: Physician Assistant

## 2024-01-05 ENCOUNTER — Ambulatory Visit: Admitting: Physician Assistant

## 2024-01-05 VITALS — BP 142/75 | HR 64 | Ht 60.0 in | Wt 162.2 lb

## 2024-01-05 DIAGNOSIS — N390 Urinary tract infection, site not specified: Secondary | ICD-10-CM

## 2024-01-05 LAB — URINALYSIS, COMPLETE
Bilirubin, UA: NEGATIVE
Glucose, UA: NEGATIVE
Ketones, UA: NEGATIVE
Nitrite, UA: POSITIVE — AB
Protein,UA: NEGATIVE
Specific Gravity, UA: 1.02 (ref 1.005–1.030)
Urobilinogen, Ur: 0.2 mg/dL (ref 0.2–1.0)
pH, UA: 6 (ref 5.0–7.5)

## 2024-01-05 LAB — MICROSCOPIC EXAMINATION: WBC, UA: >30 /HPF — AB (ref 0–5)

## 2024-01-05 MED ORDER — CEFUROXIME AXETIL 250 MG PO TABS: 250.0000 mg | ORAL_TABLET | Freq: Two times a day (BID) | ORAL | 0 refills | Status: AC

## 2024-01-05 NOTE — Progress Notes (Signed)
 01/05/2024 10:08 AM   Coleridge Davenport [birth year] [id number]  CC: Chief Complaint  Patient presents with   Urinary Tract Infection   HPI: Melody Harding is a 73 y.o. female with PMH recurrent E coli UTI who presents today for evaluation of recurrent UTI.   She was scheduled for spinal fusion yesterday, but her preop UA was positive, so her case was canceled.  She describes suprapubic pressure and malodorous urine, which are her typical symptoms with UTI.  These completely resolve with antibiotics.  She describes a 5-month period of frequent UTIs, with symptoms recurring within 2 days of stopping antibiotic therapy.  In-office UA today positive for trace intact blood, nitrites, and 1+ leukocytes; urine microscopy with >30 WBCs/HPF, 11-30 RBCs/HPF, and many bacteria. PVR previously normal at 52 mL.  PMH: Past Medical History:  Diagnosis Date   Basal cell carcinoma 08/15/2018   right chest infraclavicular   Basal cell carcinoma 12/29/2016   left mid lat forearm   Basal cell carcinoma 05/10/2016   right distal pretibial   Basal cell carcinoma 02/05/2016   left mid pretibial, left mid distal pretibial, left mid distal pretibial above ankle   Basal cell carcinoma 11/17/2015   right mid lat pretibial sup, right mid lat pretibial inf   Breast cancer (HCC)    Cancer of central portion of female breast, right 10/10/2015   Depression    GERD (gastroesophageal reflux disease)    H/O bone density study 1993   H/O colonoscopy 2012   Hyperlipemia    Osteopenia    of neck and left femur   SCC (squamous cell carcinoma) 07/27/2023   SCC IS, mid back left paraspinal . 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   right side burn preauricular, need EDC   Wears glasses    Wears hearing aid    bilateral    Surgical History: Past Surgical History:  Procedure Laterality Date   ABDOMINAL HYSTERECTOMY     part   BREAST DUCTAL SYSTEM EXCISION  Left 06/20/2014   Procedure: LEFT BREAST CENTRAL DUCT EXCISION;  Surgeon: Sim Dryer, MD;  Location: [facility];  Service: General;  Laterality: Left;   BREAST LUMPECTOMY WITH RADIOACTIVE SEED LOCALIZATION Right 10/22/2015   Procedure: BREAST LUMPECTOMY WITH RADIOACTIVE SEED LOCALIZATION;  Surgeon: Enid Harry, MD;  Location: [facility];  Service: General;  Laterality: Right;   CATARACT EXTRACTION W/PHACO Right 10/05/2017   Procedure: CATARACT EXTRACTION PHACO AND INTRAOCULAR LENS PLACEMENT (IOC) RIGHT;  Surgeon: Annell Kidney, MD;  Location: [facility];  Service: Ophthalmology;  Laterality: Right;   CATARACT EXTRACTION W/PHACO Left 11/02/2017   Procedure: CATARACT EXTRACTION PHACO AND INTRAOCULAR LENS PLACEMENT (IOC) LEFT;  Surgeon: Annell Kidney, MD;  Location: [facility];  Service: Ophthalmology;  Laterality: Left;  PATIENT WANTS EARLY ARRIVAL TIME   COLONOSCOPY     COLONOSCOPY WITH PROPOFOL  N/A 11/27/2021   Procedure: COLONOSCOPY WITH PROPOFOL ;  Surgeon: Shane Darling, MD;  Location: [facility];  Service: Endoscopy;  Laterality: N/A;   FOOT SURGERY     HAND SURGERY     TONSILLECTOMY     TUBAL LIGATION     WISDOM TOOTH EXTRACTION      Home Medications:  Allergies as of 01/05/2024       Reactions   Fosamax  [alendronate ] Other (See Comments)        Medication List        Accurate as  of Jan 05, 2024 10:08 AM. If you have any questions, ask your nurse or doctor.          acetaminophen  500 MG tablet Commonly known as: TYLENOL  Take 500 mg by mouth as needed. Reported on 11/18/2015   BACILLUS COAGULANS-INULIN PO Take by mouth.   cholecalciferol 25 MCG (1000 UNIT) tablet Commonly known as: VITAMIN D3 Take 1 tablet (1,000 Units total) by mouth daily.   docusate sodium 100 MG capsule Commonly known as: COLACE Take 100 mg by mouth daily. Takes 2 capsules at bedtime, generic brand   doxycycline  100  MG tablet Commonly known as: VIBRA -TABS TAKE ONE TAB BY MOUTH TWICE A DAY X 1 WEEK AS NEEDED FOR FLARES. TAKE WITH FOOD.   escitalopram 20 MG tablet Commonly known as: LEXAPRO Take 20 mg by mouth daily.   esomeprazole 40 MG capsule Commonly known as: NEXIUM Take 40 mg by mouth daily at 12 noon.   estradiol  0.1 MG/GM vaginal cream Commonly known as: ESTRACE  Estrogen Cream Instruction Discard applicator Apply pea sized amount to tip of finger to urethra before bed. Wash hands well after application. Use Monday, Wednesday and Friday   fluticasone 50 MCG/ACT nasal spray Commonly known as: FLONASE Place into both nostrils daily as needed. Reported on 10/15/2015   ibuprofen 200 MG tablet Commonly known as: ADVIL Take 200 mg by mouth as needed. Reported on 02/26/2016   montelukast 10 MG tablet Commonly known as: SINGULAIR Take 10 mg by mouth at bedtime.   mupirocin  ointment 2 % Commonly known as: BACTROBAN  Apply to aa's BID PRN flares.   rosuvastatin 20 MG tablet Commonly known as: CRESTOR Take 20 mg by mouth daily.        Allergies:  Allergies  Allergen Reactions   Fosamax  [Alendronate ] Other (See Comments)    Family History: Family History  Problem Relation Age of Onset   Pancreatic cancer Mother 80   Liver cancer Mother    Breast cancer Maternal Aunt 16   Breast cancer Paternal Aunt 17    Social History:   reports that she has never smoked. She has never used smokeless tobacco. She reports current alcohol use of about 2.0 standard drinks of alcohol per week. She reports that she does not use drugs.  Physical Exam: BP (!) 142/75   Pulse 64   Ht 5' (1.524 m)   Wt 162 lb 3.2 oz (73.6 kg)   BMI 31.68 kg/m   Constitutional:  Alert and oriented, no acute distress, nontoxic appearing HEENT: [place], AT Cardiovascular: No clubbing, cyanosis, or edema Respiratory: Normal respiratory effort, no increased work of breathing Skin: No rashes, bruises or suspicious  lesions Neurologic: Grossly intact, no focal deficits, moving all 4 extremities Psychiatric: Normal mood and affect  Laboratory Data: Results for orders placed or performed in visit on 01/05/24  Microscopic Examination   Collection Time: 01/05/24  9:28 AM   Urine  Result Value Ref Range   WBC, UA >30 (A) 0 - 5 /hpf   RBC, Urine 11-30 (A) 0 - 2 /hpf   Epithelial Cells (non renal) 0-10 0 - 10 /hpf   Bacteria, UA Many (A) None seen/Few  Urinalysis, Complete   Collection Time: 01/05/24  9:28 AM  Result Value Ref Range   Specific Gravity, UA 1.020 1.005 - 1.030   pH, UA 6.0 5.0 - 7.5   Color, UA Yellow Yellow   Appearance Ur Slightly cloudy Clear   Leukocytes,UA 1+ (A) Negative   Protein,UA Negative Negative/Trace  Glucose, UA Negative Negative   Ketones, UA Negative Negative   RBC, UA Trace (A) Negative   Bilirubin, UA Negative Negative   Urobilinogen, Ur 0.2 0.2 - 1.0 mg/dL   Nitrite, UA Positive (A) Negative   Microscopic Examination See below:    Assessment & Plan:   1. Recurrent UTI (Primary) Months of frequent E. coli UTIs with symptoms completely resolving on culture appropriate antibiotics, but resuming within 2 days of stopping medication.  UA again grossly positive today.  Will start empiric cefuroxime  and send for culture for further evaluation.  I am concerned about possible underlying nephrolithiasis contributing to her frequent infections and recommended CT stone study and cystoscopy for further evaluation.  She is in agreement. - Urinalysis, Complete - CULTURE, URINE COMPREHENSIVE - cefUROXime  (CEFTIN ) 250 MG tablet; Take 1 tablet (250 mg total) by mouth 2 (two) times daily with a meal for 7 days.  Dispense: 14 tablet; Refill: 0 - CT RENAL STONE STUDY; Future   Return in about 2 weeks (around 01/19/2024) for Cystoscopy and CT results.  Kathreen Pare, PA-C  [facility] [address], [address] [place], [place] [address] [phone]

## 2024-01-05 NOTE — Patient Instructions (Signed)

## 2024-01-10 ENCOUNTER — Ambulatory Visit
Admission: RE | Admit: 2024-01-10 | Discharge: 2024-01-10 | Disposition: A | Discharge status: Home / Self Care | Source: Ambulatory Visit | Attending: Physician Assistant | Admitting: Physician Assistant

## 2024-01-10 DIAGNOSIS — K449 Diaphragmatic hernia without obstruction or gangrene: Secondary | ICD-10-CM | POA: Insufficient documentation

## 2024-01-10 DIAGNOSIS — I700 Atherosclerosis of aorta: Secondary | ICD-10-CM | POA: Insufficient documentation

## 2024-01-10 DIAGNOSIS — N390 Urinary tract infection, site not specified: Secondary | ICD-10-CM | POA: Diagnosis present

## 2024-01-10 DIAGNOSIS — K828 Other specified diseases of gallbladder: Secondary | ICD-10-CM | POA: Insufficient documentation

## 2024-01-10 DIAGNOSIS — K429 Umbilical hernia without obstruction or gangrene: Secondary | ICD-10-CM | POA: Insufficient documentation

## 2024-01-11 ENCOUNTER — Ambulatory Visit: Payer: Self-pay | Admitting: Urology

## 2024-01-11 LAB — CULTURE, URINE COMPREHENSIVE

## 2024-01-13 ENCOUNTER — Ambulatory Visit: Admitting: Urology

## 2024-01-13 ENCOUNTER — Encounter: Payer: Self-pay | Admitting: Urology

## 2024-01-13 VITALS — BP 133/79 | HR 73 | Ht 60.0 in | Wt 159.0 lb

## 2024-01-13 DIAGNOSIS — N390 Urinary tract infection, site not specified: Secondary | ICD-10-CM

## 2024-01-13 LAB — URINALYSIS, COMPLETE
Bilirubin, UA: NEGATIVE
Glucose, UA: NEGATIVE
Ketones, UA: NEGATIVE
Leukocytes,UA: NEGATIVE
Nitrite, UA: NEGATIVE
Protein,UA: NEGATIVE
RBC, UA: NEGATIVE
Specific Gravity, UA: 1.01 (ref 1.005–1.030)
Urobilinogen, Ur: 0.2 mg/dL (ref 0.2–1.0)
pH, UA: 6.5 (ref 5.0–7.5)

## 2024-01-13 LAB — MICROSCOPIC EXAMINATION: Bacteria, UA: NONE SEEN

## 2024-01-13 MED ORDER — NITROFURANTOIN MACROCRYSTAL 50 MG PO CAPS: 50.0000 mg | ORAL_CAPSULE | Freq: Every day | ORAL | 2 refills | Status: DC

## 2024-01-13 NOTE — Progress Notes (Signed)
   01/13/24  CC:  Chief Complaint  Patient presents with   Cysto    HPI: Refer to previous office note 01/05/2024.  No GU abnormalities/calculi noted on CT.  She is just finishing antibiotics.  UA 3-10 RBC  Blood pressure 133/79, pulse 73, height 5' (1.524 m), weight 159 lb (72.1 kg). NED. A&Ox3.   No respiratory distress   Abd soft, NT, ND Normal external genitalia with patent urethral meatus  Cystoscopy Procedure Note  Patient identification was confirmed, informed consent was obtained, and patient was prepped using Betadine solution.  Lidocaine  jelly was administered per urethral meatus.    Procedure: - Flexible cystoscope introduced, without any difficulty.   - Thorough search of the bladder revealed:    normal urethral meatus    normal urothelium    no stones    no ulcers     no tumors    no urethral polyps    no trabeculation  - Ureteral orifices were normal in position and appearance.  Post-Procedure: - Patient tolerated the procedure well  Assessment/ Plan: No abnormalities on CT/cystoscopy 18-month trial low-dose antibiotic prophylaxis; Rx Macrodantin 50 mg daily sent to pharmacy Instructed to call for recurrent UTI symptoms while on prophylaxis PA follow-up 3 months recheck   Geraline Knapp, MD

## 2024-01-15 NOTE — Progress Notes (Signed)
  Subjective:    Chief Complaint  Patient presents with  . Urinary Symptoms    Symptoms started today with frequency Patient she is having recurrent UTI  Patient has been to Uro for the issue with imaging (neg)    History was provided by the patient and EMR. Melody Harding is a 73 y.o., female  who presents with urinary frequency and dysuria since yesterday.  Patient with history of recurrent UTI-saw urology on 6/6, underwent cystoscopy.  Was prescribed Macrodantin  50 mg daily for prophylaxis at that time.  Patient's history is to develop recurrent symptoms 2 days after the last day of antibiotics, for the last several months.  Last prescribed cefuroxime  for 7 days by urology.  No complaints of flank pain; no fever chills; no nausea vomiting, diarrhea.  Patient reports she tried taking 3 or 4 the Macrodantin  to treat the UTI-discouraged. Symptoms include: above   Patient denies: above Treatment to date: above   Past Medical History:  Diagnosis Date  . Abnormal cytology 2008  . Allergic state 2003  . Cancer (CMS/HHS-HCC) 2017  . Cataract cortical, senile 2019  . Depression 2003  . GERD (gastroesophageal reflux disease)   . History of basal cell carcinoma - followed by Dr. Alm Rhyme   . History of right breast cancer   . Hypercholesterolemia   . Insomnia   . Obesity (BMI 30-39.9)   . Seasonal allergic rhinitis    The following portions of the patient's history were reviewed and updated as appropriate: allergies, current medications, past social history, and problem list.  Review of Systems A complete review of systems was performed.  Positive and pertinent negative responses are documented in the HPI, and all other systems are negative.   Objective:     Vitals:   01/15/24 1358  BP: (!) 161/79  Pulse: 76  Temp: 36.2 C (97.1 F)  TempSrc: Oral  SpO2: 95%  Weight: 73.2 kg (161 lb 6.4 oz)  Height: 152.4 cm (5')  PainSc: 0-No pain    General Appearance: Moderately  obese.  Pleasant and cooperative.  No acute distress. HEENT:  [place]/AT, PERRLA, EOMI, sclera clear, conjunctivae pink.   Neck:  Supple, no JVD or adenopathy. Cardiovascular:  Regular rate and rhythm.  Lungs:  Clear to auscultation.  Abdomen:  Soft, nontender, nondistended. Normoactive bowel sounds.  Back: No CVA tenderness. Extremities:  No cyanosis, clubbing, or edema. Neuro:  Alert and orient x4; nonfocal.    Lab/X-ray/Treatments:   UA-large blood, moderate leukocyte esterase, moderate bacteria Urine culture-pending       Assessment:      1. Recurrent UTI (urinary tract infection) -     Urine Culture, Routine - [facility] -     cefdinir (OMNICEF) 300 mg capsule; Take 1 capsule (300 mg total) by mouth 2 (two) times daily for 10 days  Dispense: 20 capsule; Refill: 0  2. UTI symptoms -     Urinalysis w/Microscopic; Future   Cefdinir twice daily for 10 days.  Fluids.  AZO.  Urine culture pending.  Denies need for Diflucan.  Resume Macrobid  for prophylaxis meeting upon completion of cefdinir.  Follow-up with urology further problems.    Plan:   Requested Prescriptions   Signed Prescriptions Disp Refills  . cefdinir (OMNICEF) 300 mg capsule 20 capsule 0    Sig: Take 1 capsule (300 mg total) by mouth 2 (two) times daily for 10 days

## 2024-01-21 NOTE — Telephone Encounter (Signed)
 LMTRC.  Notified via mychart

## 2024-02-14 ENCOUNTER — Ambulatory Visit: Admitting: Physician Assistant

## 2024-02-14 VITALS — BP 115/76 | HR 70 | Ht 60.0 in | Wt 162.0 lb

## 2024-02-14 DIAGNOSIS — N390 Urinary tract infection, site not specified: Secondary | ICD-10-CM

## 2024-02-14 LAB — URINALYSIS, COMPLETE
Bilirubin, UA: NEGATIVE
Glucose, UA: NEGATIVE
Ketones, UA: NEGATIVE
Leukocytes,UA: NEGATIVE
Nitrite, UA: NEGATIVE
Protein,UA: NEGATIVE
Specific Gravity, UA: <1.005 — ABNORMAL LOW (ref 1.005–1.030)
Urobilinogen, Ur: 0.2 mg/dL (ref 0.2–1.0)
pH, UA: 6 (ref 5.0–7.5)

## 2024-02-14 LAB — MICROSCOPIC EXAMINATION
Bacteria, UA: NONE SEEN
RBC, Urine: NONE SEEN /HPF (ref 0–2)

## 2024-02-14 NOTE — Progress Notes (Signed)
 02/14/2024 5:13 PM   [name] 1950-12-12 [id number]  CC: Chief Complaint  Patient presents with   Follow-up   HPI: Melody Harding is a 73 y.o. female with PMH recurrent E. coli UTI with recent negative CT stone study and cystoscopy on suppressive Macrobid  50 mg daily who presents today for UTI follow-up.   She had a negative cystoscopy with Dr. [name] on 01/13/2024.  2 days later, she developed dysuria and frequency and sought care at the [facility] walk-in clinic.  UA was grossly positive, and culture grew pansensitive Pseudomonas aeruginosa.  She was started on empiric Omnicef, and transitioned to Cipro based on culture results.  Today she reports her UTI symptoms have completely resolved and she has no acute concerns today.  She remains on low-dose daily nitrofurantoin  and is tolerating it well.  In-office UA today positive for trace intact blood; urine microscopy pan negative.  PMH: Past Medical History:  Diagnosis Date   Basal cell carcinoma 08/15/2018   right chest infraclavicular   Basal cell carcinoma 12/29/2016   left mid lat forearm   Basal cell carcinoma 05/10/2016   right distal pretibial   Basal cell carcinoma 02/05/2016   left mid pretibial, left mid distal pretibial, left mid distal pretibial above ankle   Basal cell carcinoma 11/17/2015   right mid lat pretibial sup, right mid lat pretibial inf   Breast cancer (HCC)    Cancer of central portion of female breast, right 10/10/2015   Depression    GERD (gastroesophageal reflux disease)    H/O bone density study 1993   H/O colonoscopy 2012   Hyperlipemia    Osteopenia    of neck and left femur   SCC (squamous cell carcinoma) 07/27/2023   SCC IS, mid back left paraspinal . 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   right side burn preauricular, need EDC   Wears glasses    Wears hearing aid    bilateral    Surgical History: Past Surgical History:   Procedure Laterality Date   ABDOMINAL HYSTERECTOMY     part   BREAST DUCTAL SYSTEM EXCISION Left 06/20/2014   Procedure: LEFT BREAST CENTRAL DUCT EXCISION;  Surgeon: Debby Shipper, MD;  Location: [facility];  Service: General;  Laterality: Left;   BREAST LUMPECTOMY WITH RADIOACTIVE SEED LOCALIZATION Right 10/22/2015   Procedure: BREAST LUMPECTOMY WITH RADIOACTIVE SEED LOCALIZATION;  Surgeon: Donnice Bury, MD;  Location: [facility];  Service: General;  Laterality: Right;   CATARACT EXTRACTION W/PHACO Right 10/05/2017   Procedure: CATARACT EXTRACTION PHACO AND INTRAOCULAR LENS PLACEMENT (IOC) RIGHT;  Surgeon: Mittie Gaskin, MD;  Location: [facility];  Service: Ophthalmology;  Laterality: Right;   CATARACT EXTRACTION W/PHACO Left 11/02/2017   Procedure: CATARACT EXTRACTION PHACO AND INTRAOCULAR LENS PLACEMENT (IOC) LEFT;  Surgeon: Mittie Gaskin, MD;  Location: [facility];  Service: Ophthalmology;  Laterality: Left;  PATIENT WANTS EARLY ARRIVAL TIME   COLONOSCOPY     COLONOSCOPY WITH PROPOFOL  N/A 11/27/2021   Procedure: COLONOSCOPY WITH PROPOFOL ;  Surgeon: [name], MD;  Location: [facility];  Service: Endoscopy;  Laterality: N/A;   FOOT SURGERY     HAND SURGERY     TONSILLECTOMY     TUBAL LIGATION     WISDOM TOOTH EXTRACTION      Home Medications:  Allergies as of 02/14/2024       Reactions   Fosamax  [alendronate ] Other (See Comments)  Medication List        Accurate as of February 14, 2024  5:13 PM. If you have any questions, ask your nurse or doctor.          acetaminophen  500 MG tablet Commonly known as: TYLENOL  Take 500 mg by mouth as needed. Reported on 11/18/2015   BACILLUS COAGULANS-INULIN PO Take by mouth.   cholecalciferol 25 MCG (1000 UNIT) tablet Commonly known as: VITAMIN D3 Take 1 tablet (1,000 Units total) by mouth daily.   docusate sodium 100 MG capsule Commonly known as:  COLACE Take 100 mg by mouth daily. Takes 2 capsules at bedtime, generic brand   doxycycline  100 MG tablet Commonly known as: VIBRA -TABS TAKE ONE TAB BY MOUTH TWICE A DAY X 1 WEEK AS NEEDED FOR FLARES. TAKE WITH FOOD.   escitalopram 20 MG tablet Commonly known as: LEXAPRO Take 20 mg by mouth daily.   esomeprazole 40 MG capsule Commonly known as: NEXIUM Take 40 mg by mouth daily at 12 noon.   estradiol  0.1 MG/GM vaginal cream Commonly known as: ESTRACE  Estrogen Cream Instruction Discard applicator Apply pea sized amount to tip of finger to urethra before bed. Wash hands well after application. Use Monday, Wednesday and Friday   fluticasone 50 MCG/ACT nasal spray Commonly known as: FLONASE Place into both nostrils daily as needed. Reported on 10/15/2015   gabapentin 300 MG capsule Commonly known as: NEURONTIN TAKE 1 CAPSULE BY MOUTH THREE TIMES A DAY FOR 30 DAYS   ibuprofen 200 MG tablet Commonly known as: ADVIL Take 200 mg by mouth as needed. Reported on 02/26/2016   montelukast 10 MG tablet Commonly known as: SINGULAIR Take 10 mg by mouth at bedtime.   mupirocin  ointment 2 % Commonly known as: BACTROBAN  Apply to aa's BID PRN flares.   nitrofurantoin  50 MG capsule Commonly known as: MACRODANTIN  Take 1 capsule (50 mg total) by mouth daily.   ondansetron  4 MG disintegrating tablet Commonly known as: ZOFRAN -ODT PLACE 1 TABLET EVERY 8 HOURS BY TRANSLINGUAL ROUTE AS NEEDED, FOR NAUSEA.   oxyCODONE  5 MG immediate release tablet Commonly known as: Oxy IR/ROXICODONE  TAKE 1 TABLET EVERY 4 HOURS BY ORAL ROUTE AS NEEDED, FOR PAIN.   rosuvastatin 20 MG tablet Commonly known as: CRESTOR Take 20 mg by mouth daily.        Allergies:  Allergies  Allergen Reactions   Fosamax  [Alendronate ] Other (See Comments)    Family History: Family History  Problem Relation Age of Onset   Pancreatic cancer Mother 63   Liver cancer Mother    Breast cancer Maternal Aunt 27   Breast  cancer Paternal Aunt 40    Social History:   reports that she has never smoked. She has never used smokeless tobacco. She reports current alcohol use of about 2.0 standard drinks of alcohol per week. She reports that she does not use drugs.  Physical Exam: BP 115/76   Pulse 70   Ht 5' (1.524 m)   Wt 162 lb (73.5 kg)   BMI 31.64 kg/m   Constitutional:  Alert and oriented, no acute distress, nontoxic appearing HEENT: [place], AT Cardiovascular: No clubbing, cyanosis, or edema Respiratory: Normal respiratory effort, no increased work of breathing Skin: No rashes, bruises or suspicious lesions Neurologic: Grossly intact, no focal deficits, moving all 4 extremities Psychiatric: Normal mood and affect  Laboratory Data: Results for orders placed or performed in visit on 02/14/24  Microscopic Examination   Collection Time: 02/14/24  3:03 PM   Urine  Result Value Ref Range   WBC, UA 0-5 0 - 5 /hpf   RBC, Urine None seen 0 - 2 /hpf   Epithelial Cells (non renal) 0-10 0 - 10 /hpf   Bacteria, UA None seen None seen/Few  Urinalysis, Complete   Collection Time: 02/14/24  3:03 PM  Result Value Ref Range   Specific Gravity, UA <1.005 (L) 1.005 - 1.030   pH, UA 6.0 5.0 - 7.5   Color, UA Yellow Yellow   Appearance Ur Clear Clear   Leukocytes,UA Negative Negative   Protein,UA Negative Negative/Trace   Glucose, UA Negative Negative   Ketones, UA Negative Negative   RBC, UA Trace (A) Negative   Bilirubin, UA Negative Negative   Urobilinogen, Ur 0.2 0.2 - 1.0 mg/dL   Nitrite, UA Negative Negative   Microscopic Examination See below:    Assessment & Plan:   1. Recurrent UTI (Primary) UA bland after completing culture appropriate antibiotics.  Continue daily suppressive Macrobid  and keep follow-up as scheduled.  I gave her a printed copy of her UA today to provide to her spine surgeon, so she may schedule fusion. - Urinalysis, Complete  Return for Keep follow-up as scheduled.  Lucie Hones, PA-C  [facility] [place] [address] [place], [place] [address] [phone]

## 2024-03-01 ENCOUNTER — Ambulatory Visit: Admitting: Dermatology

## 2024-04-06 ENCOUNTER — Other Ambulatory Visit: Payer: Self-pay | Admitting: Urology

## 2024-04-13 ENCOUNTER — Ambulatory Visit: Admitting: Physician Assistant

## 2024-04-13 VITALS — BP 139/79 | HR 89 | Ht 60.0 in | Wt 160.0 lb

## 2024-04-13 DIAGNOSIS — N3943 Post-void dribbling: Secondary | ICD-10-CM

## 2024-04-13 DIAGNOSIS — N390 Urinary tract infection, site not specified: Secondary | ICD-10-CM

## 2024-04-13 MED ORDER — NITROFURANTOIN MACROCRYSTAL 50 MG PO CAPS
50.0000 mg | ORAL_CAPSULE | Freq: Every day | ORAL | 5 refills | Status: AC
Start: 2024-04-13 — End: ?

## 2024-04-13 NOTE — Progress Notes (Signed)
 04/13/2024 9:52 AM   Melody Harding March 24, 1951 [id number]  CC: Chief Complaint  Patient presents with   Follow-up   Recurrent UTI   HPI: Melody Harding is a 73 y.o. female with PMH recurrent E. coli UTI with recent negative CT stone study and cystoscopy on suppressive Macrobid  who presents today for follow-up.   Today she reports she is doing very well on daily suppressive Macrobid  with no UTIs since her last office visit.  She wishes to continue this.  She also describes urinary leakage, which she describes as exclusively postvoid dribbling.  She denies urge or stress incontinence.  She voids during the daytime every few hours and has nocturia x 1-2.  PMH: Past Medical History:  Diagnosis Date   Basal cell carcinoma 08/15/2018   right chest infraclavicular   Basal cell carcinoma 12/29/2016   left mid lat forearm   Basal cell carcinoma 05/10/2016   right distal pretibial   Basal cell carcinoma 02/05/2016   left mid pretibial, left mid distal pretibial, left mid distal pretibial above ankle   Basal cell carcinoma 11/17/2015   right mid lat pretibial sup, right mid lat pretibial inf   Breast cancer (HCC)    Cancer of central portion of female breast, right 10/10/2015   Depression    GERD (gastroesophageal reflux disease)    H/O bone density study 1993   H/O colonoscopy 2012   Hyperlipemia    Osteopenia    of neck and left femur   SCC (squamous cell carcinoma) 07/27/2023   SCC IS, mid back left paraspinal . 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   right side burn preauricular, need EDC   Wears glasses    Wears hearing aid    bilateral    Surgical History: Past Surgical History:  Procedure Laterality Date   ABDOMINAL HYSTERECTOMY     part   BREAST DUCTAL SYSTEM EXCISION Left 06/20/2014   Procedure: LEFT BREAST CENTRAL DUCT EXCISION;  Surgeon: Debby Shipper, MD;  Location: [facility];  Service:  General;  Laterality: Left;   BREAST LUMPECTOMY WITH RADIOACTIVE SEED LOCALIZATION Right 10/22/2015   Procedure: BREAST LUMPECTOMY WITH RADIOACTIVE SEED LOCALIZATION;  Surgeon: Donnice Bury, MD;  Location: [facility];  Service: General;  Laterality: Right;   CATARACT EXTRACTION W/PHACO Right 10/05/2017   Procedure: CATARACT EXTRACTION PHACO AND INTRAOCULAR LENS PLACEMENT (IOC) RIGHT;  Surgeon: Mittie Gaskin, MD;  Location: [facility];  Service: Ophthalmology;  Laterality: Right;   CATARACT EXTRACTION W/PHACO Left 11/02/2017   Procedure: CATARACT EXTRACTION PHACO AND INTRAOCULAR LENS PLACEMENT (IOC) LEFT;  Surgeon: Mittie Gaskin, MD;  Location: [facility];  Service: Ophthalmology;  Laterality: Left;  PATIENT WANTS EARLY ARRIVAL TIME   COLONOSCOPY     COLONOSCOPY WITH PROPOFOL  N/A 11/27/2021   Procedure: COLONOSCOPY WITH PROPOFOL ;  Surgeon: [name], MD;  Location: [facility];  Service: Endoscopy;  Laterality: N/A;   FOOT SURGERY     HAND SURGERY     TONSILLECTOMY     TUBAL LIGATION     WISDOM TOOTH EXTRACTION      Home Medications:  Allergies as of 04/13/2024       Reactions   Fosamax  [alendronate ] Other (See Comments)        Medication List        Accurate as of April 13, 2024  9:52 AM. If you have any questions, ask your nurse or doctor.  STOP taking these medications    doxycycline  100 MG tablet Commonly known as: VIBRA -TABS   ondansetron  4 MG disintegrating tablet Commonly known as: ZOFRAN -ODT   oxyCODONE  5 MG immediate release tablet Commonly known as: Oxy IR/ROXICODONE        TAKE these medications    acetaminophen  500 MG tablet Commonly known as: TYLENOL  Take 500 mg by mouth as needed. Reported on 11/18/2015   BACILLUS COAGULANS-INULIN PO Take by mouth.   cholecalciferol 25 MCG (1000 UNIT) tablet Commonly known as: VITAMIN D3 Take 1 tablet (1,000 Units total) by mouth daily.    docusate sodium 100 MG capsule Commonly known as: COLACE Take 100 mg by mouth daily. Takes 2 capsules at bedtime, generic brand   escitalopram 20 MG tablet Commonly known as: LEXAPRO Take 20 mg by mouth daily.   esomeprazole 40 MG capsule Commonly known as: NEXIUM Take 40 mg by mouth daily at 12 noon.   estradiol  0.1 MG/GM vaginal cream Commonly known as: ESTRACE  Estrogen Cream Instruction Discard applicator Apply pea sized amount to tip of finger to urethra before bed. Wash hands well after application. Use Monday, Wednesday and Friday   fluticasone 50 MCG/ACT nasal spray Commonly known as: FLONASE Place into both nostrils daily as needed. Reported on 10/15/2015   gabapentin 300 MG capsule Commonly known as: NEURONTIN TAKE 1 CAPSULE BY MOUTH THREE TIMES A DAY FOR 30 DAYS   ibuprofen 200 MG tablet Commonly known as: ADVIL Take 200 mg by mouth as needed. Reported on 02/26/2016   montelukast 10 MG tablet Commonly known as: SINGULAIR Take 10 mg by mouth at bedtime.   mupirocin  ointment 2 % Commonly known as: BACTROBAN  Apply to aa's BID PRN flares.   nitrofurantoin  50 MG capsule Commonly known as: MACRODANTIN  Take 1 capsule (50 mg total) by mouth daily.   rosuvastatin 20 MG tablet Commonly known as: CRESTOR Take 20 mg by mouth daily.        Allergies:  Allergies  Allergen Reactions   Fosamax  [Alendronate ] Other (See Comments)    Family History: Family History  Problem Relation Age of Onset   Pancreatic cancer Mother 61   Liver cancer Mother    Breast cancer Maternal Aunt 33   Breast cancer Paternal Aunt 33    Social History:   reports that she has never smoked. She has never used smokeless tobacco. She reports current alcohol use of about 2.0 standard drinks of alcohol per week. She reports that she does not use drugs.  Physical Exam: BP 139/79 (BP Location: Left Arm, Patient Position: Sitting, Cuff Size: Normal)   Pulse 89   Ht 5' (1.524 m)   Wt 160  lb (72.6 kg)   SpO2 96%   BMI 31.25 kg/m   Constitutional:  Alert and oriented, no acute distress, nontoxic appearing HEENT: [place], AT Cardiovascular: No clubbing, cyanosis, or edema Respiratory: Normal respiratory effort, no increased work of breathing Skin: No rashes, bruises or suspicious lesions Neurologic: Grossly intact, no focal deficits, moving all 4 extremities Psychiatric: Normal mood and affect  Assessment & Plan:   1. Recurrent UTI (Primary) Well-controlled on daily suppressive Macrobid  and estrogen cream.  I encouraged her to continue both of these.  Will refill Macrobid  for another 6 months, at that point we can decide together whether or not to continue. - nitrofurantoin  (MACRODANTIN ) 50 MG capsule; Take 1 capsule (50 mg total) by mouth daily.  Dispense: 30 capsule; Refill: 5  2. Post-void dribbling Suspect underlying pelvic floor relaxation issue.  I offered her pelvic floor PT, but she would like to defer this for now.  I gave her pelvic floor stretches in her AVS today and encouraged her to wiggle her toes at the end of urination to help fully empty her bladder. If these are unsuccessful, can try OAB meds.  Return in about 6 months (around 10/11/2024) for rUTI follow up.  Lucie Hones, PA-C  [facility] [place] [address] [place], [place] [address] [phone]

## 2024-04-18 ENCOUNTER — Emergency Department

## 2024-04-18 ENCOUNTER — Observation Stay
Admission: EM | Admit: 2024-04-18 | Discharge: 2024-04-20 | Disposition: A | Discharge status: Home / Self Care | Attending: Student in an Organized Health Care Education/Training Program | Admitting: Student in an Organized Health Care Education/Training Program

## 2024-04-18 ENCOUNTER — Other Ambulatory Visit: Payer: Self-pay

## 2024-04-18 DIAGNOSIS — J9601 Acute respiratory failure with hypoxia: Secondary | ICD-10-CM | POA: Diagnosis not present

## 2024-04-18 DIAGNOSIS — J90 Pleural effusion, not elsewhere classified: Secondary | ICD-10-CM | POA: Diagnosis not present

## 2024-04-18 DIAGNOSIS — I700 Atherosclerosis of aorta: Secondary | ICD-10-CM | POA: Diagnosis not present

## 2024-04-18 DIAGNOSIS — Z981 Arthrodesis status: Secondary | ICD-10-CM | POA: Insufficient documentation

## 2024-04-18 DIAGNOSIS — K449 Diaphragmatic hernia without obstruction or gangrene: Secondary | ICD-10-CM | POA: Insufficient documentation

## 2024-04-18 DIAGNOSIS — M25511 Pain in right shoulder: Secondary | ICD-10-CM | POA: Insufficient documentation

## 2024-04-18 DIAGNOSIS — K700 Alcoholic fatty liver: Secondary | ICD-10-CM | POA: Insufficient documentation

## 2024-04-18 DIAGNOSIS — R109 Unspecified abdominal pain: Secondary | ICD-10-CM | POA: Diagnosis present

## 2024-04-18 DIAGNOSIS — I2699 Other pulmonary embolism without acute cor pulmonale: Principal | ICD-10-CM | POA: Diagnosis present

## 2024-04-18 DIAGNOSIS — Z853 Personal history of malignant neoplasm of breast: Secondary | ICD-10-CM | POA: Diagnosis not present

## 2024-04-18 DIAGNOSIS — G8929 Other chronic pain: Secondary | ICD-10-CM | POA: Insufficient documentation

## 2024-04-18 LAB — CBC WITH DIFFERENTIAL/PLATELET
Abs Immature Granulocytes: 0.06 K/uL (ref 0.00–0.07)
Basophils Absolute: 0 K/uL (ref 0.0–0.1)
Basophils Relative: 0 %
Eosinophils Absolute: 0.1 K/uL (ref 0.0–0.5)
Eosinophils Relative: 1 %
HCT: 40.3 % (ref 36.0–46.0)
Hemoglobin: 13.1 g/dL (ref 12.0–15.0)
Immature Granulocytes: 1 %
Lymphocytes Relative: 10 %
Lymphs Abs: 0.8 K/uL (ref 0.7–4.0)
MCH: 29.5 pg (ref 26.0–34.0)
MCHC: 32.5 g/dL (ref 30.0–36.0)
MCV: 90.8 fL (ref 80.0–100.0)
Monocytes Absolute: 0.7 K/uL (ref 0.1–1.0)
Monocytes Relative: 9 %
Neutro Abs: 6.4 K/uL (ref 1.7–7.7)
Neutrophils Relative %: 79 %
Platelets: 178 K/uL (ref 150–400)
RBC: 4.44 MIL/uL (ref 3.87–5.11)
RDW: 14.4 % (ref 11.5–15.5)
WBC: 8.1 K/uL (ref 4.0–10.5)
nRBC: 0 % (ref 0.0–0.2)

## 2024-04-18 LAB — COMPREHENSIVE METABOLIC PANEL WITH GFR
ALT: 13 U/L (ref 0–44)
AST: 13 U/L — ABNORMAL LOW (ref 15–41)
Albumin: 3.4 g/dL — ABNORMAL LOW (ref 3.5–5.0)
Alkaline Phosphatase: 78 U/L (ref 38–126)
Anion gap: 11 (ref 5–15)
BUN: 15 mg/dL (ref 8–23)
CO2: 24 mmol/L (ref 22–32)
Calcium: 9.1 mg/dL (ref 8.9–10.3)
Chloride: 104 mmol/L (ref 98–111)
Creatinine, Ser: 0.83 mg/dL (ref 0.44–1.00)
GFR, Estimated: >60 mL/min (ref >60)
Glucose, Bld: 117 mg/dL — ABNORMAL HIGH (ref 70–99)
Potassium: 3.6 mmol/L (ref 3.5–5.1)
Sodium: 139 mmol/L (ref 135–145)
Total Bilirubin: 0.9 mg/dL (ref 0.0–1.2)
Total Protein: 6.6 g/dL (ref 6.5–8.1)

## 2024-04-18 LAB — TROPONIN I (HIGH SENSITIVITY): Troponin I (High Sensitivity): 5 ng/L (ref <18)

## 2024-04-18 LAB — URINALYSIS, ROUTINE W REFLEX MICROSCOPIC
Bacteria, UA: NONE SEEN
Bilirubin Urine: NEGATIVE
Glucose, UA: NEGATIVE mg/dL
Ketones, ur: 5 mg/dL — AB
Leukocytes,Ua: NEGATIVE
Nitrite: NEGATIVE
Protein, ur: NEGATIVE mg/dL
Specific Gravity, Urine: >1.046 — ABNORMAL HIGH (ref 1.005–1.030)
pH: 5 (ref 5.0–8.0)

## 2024-04-18 LAB — LIPASE, BLOOD: Lipase: 24 U/L (ref 11–51)

## 2024-04-18 LAB — APTT: aPTT: 29 s (ref 24–36)

## 2024-04-18 MED ORDER — IOHEXOL 350 MG/ML SOLN
75.0000 mL | Freq: Once | INTRAVENOUS | Status: AC | PRN
  Administered 2024-04-18: 75 mL via INTRAVENOUS

## 2024-04-18 MED ORDER — SODIUM CHLORIDE 0.9 % IV BOLUS
500.0000 mL | Freq: Once | INTRAVENOUS | Status: AC
  Administered 2024-04-18: 500 mL via INTRAVENOUS

## 2024-04-18 MED ORDER — IOHEXOL 300 MG/ML  SOLN
100.0000 mL | Freq: Once | INTRAMUSCULAR | Status: AC | PRN
  Administered 2024-04-18: 100 mL via INTRAVENOUS

## 2024-04-18 MED ORDER — MORPHINE SULFATE (PF) 4 MG/ML IV SOLN
4.0000 mg | Freq: Once | INTRAVENOUS | Status: AC
  Administered 2024-04-18: 4 mg via INTRAVENOUS
  Filled 2024-04-18: qty 1

## 2024-04-18 MED ORDER — HEPARIN (PORCINE) 25000 UT/250ML-% IV SOLN
900.0000 [IU]/h | INTRAVENOUS | Status: DC
  Administered 2024-04-18: 1000 [IU]/h via INTRAVENOUS
  Filled 2024-04-18: qty 250

## 2024-04-18 MED ORDER — KETOROLAC TROMETHAMINE 15 MG/ML IJ SOLN
15.0000 mg | Freq: Once | INTRAMUSCULAR | Status: AC
  Administered 2024-04-18: 15 mg via INTRAVENOUS
  Filled 2024-04-18: qty 1

## 2024-04-18 MED ORDER — HEPARIN BOLUS VIA INFUSION
4000.0000 [IU] | Freq: Once | INTRAVENOUS | Status: AC
  Administered 2024-04-18: 4000 [IU] via INTRAVENOUS
  Filled 2024-04-18: qty 4000

## 2024-04-18 NOTE — ED Triage Notes (Signed)
 Pt arrived via [facility] with c/o left sided flank pain that started last night. Pt is unable to lay down due to the pain and it hurts if they take a deep breathe. Pt took pain meds around 1430 and it make her sleepy but it didn't help the pain. Pt had L1-L2 fusion 6 weeks ago. Pt is A&Ox4 during triage.

## 2024-04-18 NOTE — ED Provider Notes (Signed)
 [facility] Provider Note    Event Date/Time   First MD Initiated Contact with Patient 04/18/24 2028     (approximate)   History   Chief Complaint: Flank Pain   HPI  Melody Harding is a 73 y.o. female with a history of GERD, breast cancer who comes ED complaining of left flank pain starting last night.  Hurts to take a deep breath.  Constant.  No vomiting or diarrhea.  No shortness of breath, no fever.        Past Medical History:  Diagnosis Date   Basal cell carcinoma 08/15/2018   right chest infraclavicular   Basal cell carcinoma 12/29/2016   left mid lat forearm   Basal cell carcinoma 05/10/2016   right distal pretibial   Basal cell carcinoma 02/05/2016   left mid pretibial, left mid distal pretibial, left mid distal pretibial above ankle   Basal cell carcinoma 11/17/2015   right mid lat pretibial sup, right mid lat pretibial inf   Breast cancer (HCC)    Cancer of central portion of female breast, right 10/10/2015   Depression    GERD (gastroesophageal reflux disease)    H/O bone density study 1993   H/O colonoscopy 2012   Hyperlipemia    Osteopenia    of neck and left femur   SCC (squamous cell carcinoma) 07/27/2023   SCC IS, mid back left paraspinal . 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   right side burn preauricular, need EDC   Wears glasses    Wears hearing aid    bilateral    Current Outpatient Rx   Order #: [id number] Class: Historical Med   Order #: [id number] Class: Historical Med   Order #: [id number] Class: Historical Med   Order #: [id number] Class: Historical Med   Order #: [id number] Class: No Print   Order #: [id number] Class: Historical Med   Order #: [id number] Class: Historical Med   Order #: [id number] Class: Historical Med   Order #: [id number] Class: Normal   Order #: [id number] Class: Historical Med   Order #: [id number] Class: Historical Med   Order #: [id number] Class:  Historical Med   Order #: [id number] Class: Historical Med   Order #: [id number] Class: Normal   Order #: [id number] Class: Normal   Order #: [id number] Class: Historical Med    Past Surgical History:  Procedure Laterality Date   ABDOMINAL HYSTERECTOMY     part   BREAST DUCTAL SYSTEM EXCISION Left 06/20/2014   Procedure: LEFT BREAST CENTRAL DUCT EXCISION;  Surgeon: Debby Shipper, MD;  Location: [facility];  Service: General;  Laterality: Left;   BREAST LUMPECTOMY WITH RADIOACTIVE SEED LOCALIZATION Right 10/22/2015   Procedure: BREAST LUMPECTOMY WITH RADIOACTIVE SEED LOCALIZATION;  Surgeon: Donnice Bury, MD;  Location: [facility];  Service: General;  Laterality: Right;   CATARACT EXTRACTION W/PHACO Right 10/05/2017   Procedure: CATARACT EXTRACTION PHACO AND INTRAOCULAR LENS PLACEMENT (IOC) RIGHT;  Surgeon: Mittie Gaskin, MD;  Location: [facility];  Service: Ophthalmology;  Laterality: Right;   CATARACT EXTRACTION W/PHACO Left 11/02/2017   Procedure: CATARACT EXTRACTION PHACO AND INTRAOCULAR LENS PLACEMENT (IOC) LEFT;  Surgeon: Mittie Gaskin, MD;  Location: [facility];  Service: Ophthalmology;  Laterality: Left;  PATIENT WANTS EARLY ARRIVAL TIME   COLONOSCOPY     COLONOSCOPY WITH PROPOFOL  N/A 11/27/2021   Procedure: COLONOSCOPY WITH PROPOFOL ;  Surgeon: [name], MD;  Location: [facility];  Service: Endoscopy;  Laterality: N/A;   FOOT SURGERY  HAND SURGERY     TONSILLECTOMY     TUBAL LIGATION     WISDOM TOOTH EXTRACTION      Physical Exam   Triage Vital Signs: ED Triage Vitals  Encounter Vitals Group     BP 04/18/24 1836 (!) 142/70     Girls Systolic BP Percentile --      Girls Diastolic BP Percentile --      Boys Systolic BP Percentile --      Boys Diastolic BP Percentile --      Pulse Rate 04/18/24 1836 85     Resp 04/18/24 1836 17     Temp 04/18/24 1836 98.9 F (37.2 C)     Temp Source 04/18/24 1836  Oral     SpO2 04/18/24 1836 95 %     Weight 04/18/24 1844 160 lb (72.6 kg)     Height 04/18/24 1844 5' (1.524 m)     Head Circumference --      Peak Flow --      Pain Score 04/18/24 1842 5     Pain Loc --      Pain Education --      Exclude from Growth Chart --     Most recent vital signs: Vitals:   04/18/24 2130 04/18/24 2226  BP: 133/62 137/66  Pulse: 78 77  Resp: 18 18  Temp:    SpO2: 95% 98%    General: Awake, no distress.  CV:  Good peripheral perfusion.  Regular rate rhythm Resp:  Normal effort.  Clear lungs Abd:  No distention.  Soft left-sided abdominal tenderness Other:  Moist oral mucosa   ED Results / Procedures / Treatments   Labs (all labs ordered are listed, but only abnormal results are displayed) Labs Reviewed  COMPREHENSIVE METABOLIC PANEL WITH GFR - Abnormal; Notable for the following components:      Result Value   Glucose, Bld 117 (*)    Albumin 3.4 (*)    AST 13 (*)    All other components within normal limits  LIPASE, BLOOD  CBC WITH DIFFERENTIAL/PLATELET  APTT  URINALYSIS, ROUTINE W REFLEX MICROSCOPIC  TROPONIN I (HIGH SENSITIVITY)     EKG    RADIOLOGY CT abdomen pelvis interpreted by me, no signs of bowel obstruction.  Radiology report reviewed.   PROCEDURES:  .Critical Care  Performed by: Viviann Pastor, MD Authorized by: Viviann Pastor, MD   Critical care provider statement:    Critical care time (minutes):  35   Critical care time was exclusive of:  Separately billable procedures and treating other patients   Critical care was necessary to treat or prevent imminent or life-threatening deterioration of the following conditions:  Respiratory failure and circulatory failure   Critical care was time spent personally by me on the following activities:  Development of treatment plan with patient or surrogate, discussions with consultants, evaluation of patient's response to treatment, examination of patient, obtaining  history from patient or surrogate, ordering and performing treatments and interventions, ordering and review of laboratory studies, ordering and review of radiographic studies, pulse oximetry, re-evaluation of patient's condition and review of old charts   Care discussed with: admitting provider      MEDICATIONS ORDERED IN ED: Medications  sodium chloride  0.9 % bolus 500 mL (0 mLs Intravenous Stopped 04/18/24 2212)  ketorolac  (TORADOL ) 15 MG/ML injection 15 mg (15 mg Intravenous Given 04/18/24 2112)  morphine  (PF) 4 MG/ML injection 4 mg (4 mg Intravenous Given 04/18/24 2112)  iohexol  (OMNIPAQUE ) 300  MG/ML solution 100 mL (100 mLs Intravenous Contrast Given 04/18/24 2159)  iohexol  (OMNIPAQUE ) 350 MG/ML injection 75 mL (75 mLs Intravenous Contrast Given 04/18/24 2233)     IMPRESSION / MDM / ASSESSMENT AND PLAN / ED COURSE  I reviewed the triage vital signs and the nursing notes.  DDx: Bowel obstruction/ileus, renal colic, diverticulitis, radicular pain  Patient's presentation is most consistent with acute presentation with potential threat to life or bodily function.  Patient presents with left flank pain and abdominal tenderness.  Had surgery 6 weeks ago with good follow-up visit.  Will obtain CT.   Clinical Course as of 04/18/24 2313  Wed Apr 18, 2024  2220 CT a/p d/w radiology Dr. Scott - reveals B/L PE. Will obtain CTA chest to further assess [PS]    Clinical Course User Index [PS] Viviann Pastor, MD    ----------------------------------------- 11:12 PM on 04/18/2024 ----------------------------------------- CT chest confirms bilateral PE in the lower lung segments, no right heart strain.  Oxygen saturation decreased to 89% on room air, patient placed on 2 L nasal cannula.  Will start heparin  and admit   FINAL CLINICAL IMPRESSION(S) / ED DIAGNOSES   Final diagnoses:  Bilateral pulmonary embolism (HCC)  Acute respiratory failure with hypoxia (HCC)     Rx / DC Orders    ED Discharge Orders     None        Note:  This document was prepared using Dragon voice recognition software and may include unintentional dictation errors.   Viviann Pastor, MD 04/18/24 [phone]

## 2024-04-18 NOTE — ED Notes (Signed)
 Pt and pt's husband requesting update from provider... Viviann, MD notified of request at this time.

## 2024-04-18 NOTE — Progress Notes (Signed)
 PHARMACY - ANTICOAGULATION CONSULT NOTE  Pharmacy Consult for Heparin   Indication: pulmonary embolus  Allergies  Allergen Reactions   Fosamax  [Alendronate ] Other (See Comments)    Patient Measurements: Height: 5' (152.4 cm) Weight: 72.6 kg (160 lb) IBW/kg (Calculated) : 45.5 HEPARIN  DW (KG): 61.6  Vital Signs: Temp: 98.9 F (37.2 C) (09/10 1836) Temp Source: Oral (09/10 1836) BP: 126/70 (09/10 2300) Pulse Rate: 75 (09/10 2300)  Labs: Recent Labs    04/18/24 2111 04/18/24 2226  HGB 13.1  --   HCT 40.3  --   PLT 178  --   APTT  --  29  CREATININE 0.83  --   TROPONINIHS 5  --     Estimated Creatinine Clearance: 54.5 mL/min (by C-G formula based on SCr of 0.83 mg/dL).   Medical History: Past Medical History:  Diagnosis Date   Basal cell carcinoma 08/15/2018   right chest infraclavicular   Basal cell carcinoma 12/29/2016   left mid lat forearm   Basal cell carcinoma 05/10/2016   right distal pretibial   Basal cell carcinoma 02/05/2016   left mid pretibial, left mid distal pretibial, left mid distal pretibial above ankle   Basal cell carcinoma 11/17/2015   right mid lat pretibial sup, right mid lat pretibial inf   Breast cancer (HCC)    Cancer of central portion of female breast, right 10/10/2015   Depression    GERD (gastroesophageal reflux disease)    H/O bone density study 1993   H/O colonoscopy 2012   Hyperlipemia    Osteopenia    of neck and left femur   SCC (squamous cell carcinoma) 07/27/2023   SCC IS, mid back left paraspinal . 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  No prior anticoag noted. CrCl = 54.5 ml/min   Goal of Therapy:  Heparin  level 0.3-0.7  units/ml Monitor platelets by anticoagulation protocol: Yes   Plan:  Give 4000 units bolus x 1 Start heparin  infusion at 1000 units/hr Check anti-Xa level in 8 hours and daily while on heparin  Continue to monitor H&H and platelets  Donnella Morford D 04/18/2024,11:21 PM

## 2024-04-18 NOTE — ED Triage Notes (Signed)
 First Nurse Note: Patient to ED via [facility] from home for back pain/left flank pain. Had back surgery 6 weeks ago- taking pain meds from same but not helping. No meds since 2:30.

## 2024-04-18 NOTE — ED Notes (Signed)
 Pt presented to ED with c/o LLQ abd pain radiating to left flank. States recent MRI for recent back surgery that showed small kidney stone. Denies hx of kidney stones. States pain started last night. Pt aware urine sample needed and states unable to provided one at this time.

## 2024-04-18 NOTE — ED Notes (Signed)
Purwick placed at this time.

## 2024-04-19 ENCOUNTER — Telehealth (HOSPITAL_COMMUNITY): Payer: Self-pay | Admitting: Pharmacy Technician

## 2024-04-19 ENCOUNTER — Observation Stay
Admit: 2024-04-19 | Discharge: 2024-04-19 | Disposition: A | Discharge status: Home / Self Care | Attending: Internal Medicine

## 2024-04-19 ENCOUNTER — Other Ambulatory Visit (HOSPITAL_COMMUNITY): Payer: Self-pay

## 2024-04-19 ENCOUNTER — Observation Stay

## 2024-04-19 DIAGNOSIS — J9601 Acute respiratory failure with hypoxia: Secondary | ICD-10-CM | POA: Diagnosis not present

## 2024-04-19 DIAGNOSIS — R0603 Acute respiratory distress: Secondary | ICD-10-CM

## 2024-04-19 DIAGNOSIS — I2699 Other pulmonary embolism without acute cor pulmonale: Secondary | ICD-10-CM | POA: Diagnosis not present

## 2024-04-19 DIAGNOSIS — G8929 Other chronic pain: Secondary | ICD-10-CM | POA: Insufficient documentation

## 2024-04-19 DIAGNOSIS — Z981 Arthrodesis status: Secondary | ICD-10-CM

## 2024-04-19 LAB — ECHOCARDIOGRAM COMPLETE
AR max vel: 2.78 cm2
AV Area VTI: 2.97 cm2
AV Area mean vel: 2.79 cm2
AV Mean grad: 4 mmHg
AV Peak grad: 8 mmHg
Ao pk vel: 1.41 m/s
Area-P 1/2: 3.85 cm2
Calc EF: 68.2 %
Height: 60 in
MV VTI: 3.01 cm2
P 1/2 time: 300 ms
S' Lateral: 2.5 cm
Single Plane A2C EF: 69.5 %
Single Plane A4C EF: 68.5 %
Weight: 2560 [oz_av]

## 2024-04-19 LAB — HEPARIN LEVEL (UNFRACTIONATED): Heparin Unfractionated: 0.86 [IU]/mL — ABNORMAL HIGH (ref 0.30–0.70)

## 2024-04-19 MED ORDER — DOCUSATE SODIUM 100 MG PO CAPS: 100.0000 mg | ORAL_CAPSULE | Freq: Every day | ORAL | Status: DC

## 2024-04-19 MED ORDER — ROSUVASTATIN CALCIUM 10 MG PO TABS
20.0000 mg | ORAL_TABLET | Freq: Every day | ORAL | Status: DC
  Administered 2024-04-19 – 2024-04-20 (×2): 20 mg via ORAL
  Filled 2024-04-19: qty 1
  Filled 2024-04-19: qty 2

## 2024-04-19 MED ORDER — PERFLUTREN LIPID MICROSPHERE
1.0000 mL | INTRAVENOUS | Status: AC | PRN
  Administered 2024-04-19: 2 mL via INTRAVENOUS

## 2024-04-19 MED ORDER — ACETAMINOPHEN 650 MG RE SUPP: 650.0000 mg | Freq: Four times a day (QID) | RECTAL | Status: DC | PRN

## 2024-04-19 MED ORDER — ONDANSETRON HCL 4 MG/2ML IJ SOLN
4.0000 mg | Freq: Four times a day (QID) | INTRAMUSCULAR | Status: DC | PRN
  Administered 2024-04-19 – 2024-04-20 (×2): 4 mg via INTRAVENOUS
  Filled 2024-04-19 (×2): qty 2

## 2024-04-19 MED ORDER — ONDANSETRON HCL 4 MG PO TABS: 4.0000 mg | ORAL_TABLET | Freq: Four times a day (QID) | ORAL | Status: DC | PRN

## 2024-04-19 MED ORDER — PANTOPRAZOLE SODIUM 40 MG PO TBEC
40.0000 mg | DELAYED_RELEASE_TABLET | Freq: Every day | ORAL | Status: DC
  Administered 2024-04-19 – 2024-04-20 (×2): 40 mg via ORAL
  Filled 2024-04-19 (×2): qty 1

## 2024-04-19 MED ORDER — ESCITALOPRAM OXALATE 10 MG PO TABS
20.0000 mg | ORAL_TABLET | Freq: Every day | ORAL | Status: DC
  Administered 2024-04-19 – 2024-04-20 (×2): 20 mg via ORAL
  Filled 2024-04-19 (×2): qty 2

## 2024-04-19 MED ORDER — BISACODYL 10 MG RE SUPP
10.0000 mg | Freq: Once | RECTAL | Status: AC
  Administered 2024-04-19: 10 mg via RECTAL
  Filled 2024-04-19: qty 1

## 2024-04-19 MED ORDER — APIXABAN (ELIQUIS) VTE STARTER PACK (10MG AND 5MG): ORAL_TABLET | ORAL | 0 refills | Status: DC

## 2024-04-19 MED ORDER — APIXABAN 5 MG PO TABS: 5.0000 mg | ORAL_TABLET | Freq: Two times a day (BID) | ORAL | Status: DC

## 2024-04-19 MED ORDER — NITROFURANTOIN MACROCRYSTAL 50 MG PO CAPS
50.0000 mg | ORAL_CAPSULE | Freq: Every day | ORAL | Status: DC
  Administered 2024-04-19: 50 mg via ORAL
  Filled 2024-04-19 (×2): qty 1

## 2024-04-19 MED ORDER — HYDROCODONE-ACETAMINOPHEN 5-325 MG PO TABS
1.0000 | ORAL_TABLET | Freq: Four times a day (QID) | ORAL | Status: DC | PRN
  Administered 2024-04-19: 1 via ORAL
  Filled 2024-04-19: qty 1

## 2024-04-19 MED ORDER — PROCHLORPERAZINE EDISYLATE 10 MG/2ML IJ SOLN
10.0000 mg | Freq: Once | INTRAMUSCULAR | Status: AC
  Administered 2024-04-19: 10 mg via INTRAVENOUS
  Filled 2024-04-19: qty 2

## 2024-04-19 MED ORDER — SODIUM CHLORIDE 0.9 % IV SOLN: INTRAVENOUS | Status: DC

## 2024-04-19 MED ORDER — MONTELUKAST SODIUM 10 MG PO TABS
10.0000 mg | ORAL_TABLET | Freq: Every day | ORAL | Status: DC
  Administered 2024-04-19: 10 mg via ORAL
  Filled 2024-04-19: qty 1

## 2024-04-19 MED ORDER — MORPHINE SULFATE (PF) 2 MG/ML IV SOLN
2.0000 mg | INTRAVENOUS | Status: DC | PRN
  Administered 2024-04-19: 2 mg via INTRAVENOUS
  Filled 2024-04-19: qty 1

## 2024-04-19 MED ORDER — APIXABAN 5 MG PO TABS
10.0000 mg | ORAL_TABLET | Freq: Two times a day (BID) | ORAL | Status: DC
  Administered 2024-04-19 – 2024-04-20 (×3): 10 mg via ORAL
  Filled 2024-04-19 (×3): qty 2

## 2024-04-19 MED ORDER — ACETAMINOPHEN 325 MG PO TABS
650.0000 mg | ORAL_TABLET | Freq: Four times a day (QID) | ORAL | Status: DC | PRN
  Administered 2024-04-19: 650 mg via ORAL
  Filled 2024-04-19: qty 2

## 2024-04-19 NOTE — Progress Notes (Signed)
 PHARMACY - ANTICOAGULATION CONSULT NOTE  Pharmacy Consult for Heparin   Indication: pulmonary embolus  Allergies  Allergen Reactions   Fosamax  [Alendronate ] Other (See Comments)    Patient Measurements: Height: 5' (152.4 cm) Weight: 72.6 kg (160 lb) IBW/kg (Calculated) : 45.5 HEPARIN  DW (KG): 61.6  Vital Signs: Temp: 99.1 F (37.3 C) (09/10 2324) Temp Source: Oral (09/10 2324) BP: 99/51 (09/11 0051) Pulse Rate: 69 (09/11 0051)  Labs: Recent Labs    04/18/24 2111 04/18/24 2226  HGB 13.1  --   HCT 40.3  --   PLT 178  --   APTT  --  29  CREATININE 0.83  --   TROPONINIHS 5  --     Estimated Creatinine Clearance: 54.5 mL/min (by C-G formula based on SCr of 0.83 mg/dL).   Medical History: Past Medical History:  Diagnosis Date   Basal cell carcinoma 08/15/2018   right chest infraclavicular   Basal cell carcinoma 12/29/2016   left mid lat forearm   Basal cell carcinoma 05/10/2016   right distal pretibial   Basal cell carcinoma 02/05/2016   left mid pretibial, left mid distal pretibial, left mid distal pretibial above ankle   Basal cell carcinoma 11/17/2015   right mid lat pretibial sup, right mid lat pretibial inf   Breast cancer (HCC)    Cancer of central portion of female breast, right 10/10/2015   Depression    GERD (gastroesophageal reflux disease)    H/O bone density study 1993   H/O colonoscopy 2012   Hyperlipemia    Osteopenia    of neck and left femur   SCC (squamous cell carcinoma) 07/27/2023   SCC IS, mid back left paraspinal . 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   No prior anticoag noted. CrCl = 54.5 ml/min   Goal of Therapy:  Heparin  level 0.3-0.7  units/ml Monitor platelets by anticoagulation protocol: Yes   Plan:  Give 4000 units bolus x 1 Start heparin  infusion at 1000 units/hr Check anti-Xa level in 8 hours and daily while on heparin  Continue to monitor H&H and platelets  Andrew Blasius D 04/19/2024,1:00 AM

## 2024-04-19 NOTE — ED Notes (Signed)
 Pt in bed, pt c/o nausea, zofran  given, crackers given because pt states that she hasn't had anything to eat all day today.  Pt continues to c/o nausea, md notified

## 2024-04-19 NOTE — Progress Notes (Signed)
 PHARMACY - ANTICOAGULATION CONSULT NOTE  Pharmacy Consult for Heparin   Indication: pulmonary embolus  Allergies  Allergen Reactions   Fosamax  [Alendronate ] Other (See Comments)    Patient Measurements: Height: 5' (152.4 cm) Weight: 72.6 kg (160 lb) IBW/kg (Calculated) : 45.5 HEPARIN  DW (KG): 61.6  Vital Signs: Temp: 98.4 F (36.9 C) (09/11 0729) Temp Source: Oral (09/11 0729) BP: 133/68 (09/11 0729) Pulse Rate: 65 (09/11 0729)  Labs: Recent Labs    04/18/24 2111 04/18/24 2226 04/19/24 0756  HGB 13.1  --   --   HCT 40.3  --   --   PLT 178  --   --   APTT  --  29  --   HEPARINUNFRC  --   --  0.86*  CREATININE 0.83  --   --   TROPONINIHS 5  --   --     Estimated Creatinine Clearance: 54.5 mL/min (by C-G formula based on SCr of 0.83 mg/dL).   Medical History: Past Medical History:  Diagnosis Date   Basal cell carcinoma 08/15/2018   right chest infraclavicular   Basal cell carcinoma 12/29/2016   left mid lat forearm   Basal cell carcinoma 05/10/2016   right distal pretibial   Basal cell carcinoma 02/05/2016   left mid pretibial, left mid distal pretibial, left mid distal pretibial above ankle   Basal cell carcinoma 11/17/2015   right mid lat pretibial sup, right mid lat pretibial inf   Breast cancer (HCC)    Cancer of central portion of female breast, right 10/10/2015   Depression    GERD (gastroesophageal reflux disease)    H/O bone density study 1993   H/O colonoscopy 2012   Hyperlipemia    Osteopenia    of neck and left femur   SCC (squamous cell carcinoma) 07/27/2023   SCC IS, mid back left paraspinal . 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   No prior anticoag  noted. CrCl = 54.5 ml/min   0911 0756 HL 0.86, supratherapeutic   Goal of Therapy:  Heparin  level 0.3-0.7 units/ml Monitor platelets by anticoagulation protocol: Yes   Plan:  Heparin  level supratherapeutic  Decrease heparin  infusion rate to 900 units/hr Check heparin  level in 8 hours after rate change Monitor CBC daily while on heparin  Will continue to monitor for appropriate transition to oral anticoagulation   Melody Harding, PharmD, BCPS 04/19/2024,9:02 AM

## 2024-04-19 NOTE — Evaluation (Signed)
 Physical Therapy Evaluation Patient Details Name: Melody Harding MRN: [id number] DOB: 1950/10/16 Today's Date: 04/19/2024  History of Present Illness  Pt is a 73 y/o F admitted on 04/18/24 after presenting with c/o L sided flank pain worse with deep inspiration & SOB. Pt found to have B PE, suspect provoked from recent orthopedic sx. PMH: R breast CA, back sx (L1-2 fusion 03/07/24), chronic shoulder pain, HLD  Clinical Impression  MD cleared pt for mobilization with therapy. Pt seen for PT evaluation with husband present. Pt reports prior to admission she was ambulatory without AD but has been using RW since sx. On this date, pt completes bed mobility with supervision without maintaining back precautions. Pt is able to transfer to standing without BUE support with supervision & ambulate in hallway with BUE on IV pole with supervision<>CGA. Pt does c/o chest pain with inhalation, SPO2 as low as 86% after gait but increases to >/= 90% on room air. Question accuracy of SPO2 reading as pt with cold fingers & Dupuytren's contractures (MD made aware). Will continue to follow pt acutely to progress mobility as able.        If plan is discharge home, recommend the following: Assistance with cooking/housework;Assist for transportation;A little help with bathing/dressing/bathroom;A little help with walking and/or transfers   Can travel by private vehicle        Equipment Recommendations None recommended by PT  Recommendations for Other Services       Functional Status Assessment Patient has had a recent decline in their functional status and demonstrates the ability to make significant improvements in function in a reasonable and predictable amount of time.     Precautions / Restrictions Precautions Precautions: Fall;Back (sx 03/07/24) Restrictions Weight Bearing Restrictions Per Provider Order: No      Mobility  Bed Mobility Overal bed mobility: Needs Assistance Bed Mobility: Supine to Sit,  Sit to Supine     Supine to sit: Supervision, HOB elevated, Used rails (pt transitions from semi fowler to long sitting, then moves BLE to EOB) Sit to supine: Supervision, HOB elevated        Transfers Overall transfer level: Needs assistance Equipment used: None Transfers: Sit to/from Stand Sit to Stand: Supervision                Ambulation/Gait Ambulation/Gait assistance: Contact guard assist, Supervision Gait Distance (Feet): 60 Feet Assistive device: IV Pole Gait Pattern/deviations: Decreased step length - right, Decreased step length - left, Decreased stride length Gait velocity: decreased     General Gait Details: BUE on IV pole  Stairs            Wheelchair Mobility     Tilt Bed    Modified Rankin (Stroke Patients Only)       Balance Overall balance assessment: Needs assistance Sitting-balance support: Feet supported Sitting balance-Leahy Scale: Good     Standing balance support: During functional activity, Bilateral upper extremity supported, Reliant on assistive device for balance Standing balance-Leahy Scale: Fair                               Pertinent Vitals/Pain Pain Assessment Pain Assessment: Faces Faces Pain Scale: Hurts little more Pain Location: chest with deep inhalation Pain Descriptors / Indicators: Discomfort, Guarding, Grimacing Pain Intervention(s): Monitored during session    Home Living Family/patient expects to be discharged to:: Private residence Living Arrangements: Spouse/significant other Available Help at Discharge: Family Type of Home:  House (Townhome) Home Access: Level entry       Home Layout: One level Home Equipment: Rollator (4 wheels);Rolling Walker (2 wheels)      Prior Function               Mobility Comments: Pt reports prior to sx in July she was ambulatory without AD but since sx has been using RW. Recently started OPPT.       Extremity/Trunk Assessment   Upper  Extremity Assessment Upper Extremity Assessment: RUE deficits/detail;Generalized weakness;LUE deficits/detail RUE Deficits / Details: Dupuytren's contracture in digits 3-5 LUE Deficits / Details: Dupuytren's contracture in digits 2-4    Lower Extremity Assessment Lower Extremity Assessment: Generalized weakness    Cervical / Trunk Assessment Cervical / Trunk Assessment: Back Surgery (03/07/24)  Communication   Communication Communication: Impaired Factors Affecting Communication: Hearing impaired (has hearing aides)    Cognition Arousal: Alert Behavior During Therapy: WFL for tasks assessed/performed   PT - Cognitive impairments: No apparent impairments                       PT - Cognition Comments: Pt does not maintain back precautions during bed mobility. Following commands: Intact       Cueing Cueing Techniques: Verbal cues     General Comments      Exercises     Assessment/Plan    PT Assessment Patient needs continued PT services  PT Problem List Decreased strength;Cardiopulmonary status limiting activity;Decreased activity tolerance;Decreased balance;Decreased mobility;Decreased knowledge of precautions       PT Treatment Interventions DME instruction;Balance training;Gait training;Neuromuscular re-education;Functional mobility training;Therapeutic activities;Therapeutic exercise;Manual techniques;Patient/family education    PT Goals (Current goals can be found in the Care Plan section)  Acute Rehab PT Goals Patient Stated Goal: get better PT Goal Formulation: With patient Time For Goal Achievement: 05/03/24 Potential to Achieve Goals: Good    Frequency Min 2X/week     Co-evaluation               AM-PAC PT 6 Clicks Mobility  Outcome Measure Help needed turning from your back to your side while in a flat bed without using bedrails?: None Help needed moving from lying on your back to sitting on the side of a flat bed without using  bedrails?: A Little Help needed moving to and from a bed to a chair (including a wheelchair)?: A Little Help needed standing up from a chair using your arms (e.g., wheelchair or bedside chair)?: A Little Help needed to walk in hospital room?: A Little Help needed climbing 3-5 steps with a railing? : A Little 6 Click Score: 19    End of Session   Activity Tolerance: Patient tolerated treatment well Patient left: in bed;with call bell/phone within reach;with bed alarm set;with family/visitor present Nurse Communication:  (notified MD of O2) PT Visit Diagnosis: Muscle weakness (generalized) (M62.81);Other abnormalities of gait and mobility (R26.89)    Time: 1010-1026 PT Time Calculation (min) (ACUTE ONLY): 16 min   Charges:   PT Evaluation $PT Eval Moderate Complexity: 1 Mod   PT General Charges $$ ACUTE PT VISIT: 1 Visit         Richerd Pinal, PT, DPT 04/19/24, 11:55 AM   Richerd CHRISTELLA Pinal 04/19/2024, 11:53 AM

## 2024-04-19 NOTE — ED Notes (Signed)
Pt in bed with eyes closed, resps even and unlabored.  

## 2024-04-19 NOTE — Telephone Encounter (Signed)
 Patient Product/process development scientist completed.    The patient is insured through [employer]. Patient has Medicare and is not eligible for a copay card, but may be able to apply for patient assistance or Medicare RX Payment Plan (Patient Must reach out to their plan, if eligible for payment plan), if available.    Ran test claim for Eliquis 5 mg and the current 30 day co-pay is $40.00.   This test claim was processed through [facility]- copay amounts may vary at other pharmacies due to pharmacy/plan contracts, or as the patient moves through the different stages of their insurance plan.     Roland Earl, CPHT Pharmacy Technician III Certified Patient Advocate [facility] Patient Advocate Team Direct Number: [phone]  Fax: [phone]

## 2024-04-19 NOTE — ED Notes (Signed)
 Pt walked well in hallway with O2 of 95% on room air.

## 2024-04-19 NOTE — Assessment & Plan Note (Signed)
 No acute issues suspected Patient started PT the day prior on 9/10 Can continue PT while in house

## 2024-04-19 NOTE — ED Notes (Signed)
 Pt in bed, pt states that she is too weak to be d/c

## 2024-04-19 NOTE — H&P (Signed)
 History and Physical    Patient: Melody Harding FMW:[id number] DOB: 15-Aug-1950 DOA: 04/18/2024 DOS: the patient was seen and examined on 04/19/2024 PCP: Alla Amis, MD  Patient coming from: Home  Chief Complaint:  Chief Complaint  Patient presents with   Flank Pain    HPI: Melody Harding is a 73 y.o. female with medical history significant for Right breast cancer, s/p treatment 8 years ago, back surgery 6 weeks ago, chronic shoulder pain with subacromial injections, being admitted for bilateral PE.  She presented with left-sided flank pain worse with deep inspiration and shortness of breath, which developed following her first postoperative physical therapy session earlier in the day.  She stated that since her back surgery she has not been moving around a lot and following her first physical therapy session she felt very exhausted and short winded.  Also mentioned that she developed lower extremity cramping pain of the left leg about a week following the surgery and was treated with prednisone.  She denies chest pain or lower extremity swelling. In the ED, vitals were within normal limits. Labs were unremarkable and included CBC, CMP, lipase, troponin and UA. EKG not done. CT abdomen and pelvis with no acute intra-abdominal or pelvic abnormality and no acute concerns related to lumbar spinal fusion hardware (please see report).  Did show some filling defects lung bases and follow-up CTA chest showed bilateral lower lobe emboli without heart strain. Patient started on a heparin  infusion with bolus.  Morphine  administered for pain. Admission requested.     Review of Systems: As mentioned in the history of present illness. All other systems reviewed and are negative.  Past Medical History:  Diagnosis Date   Basal cell carcinoma 08/15/2018   right chest infraclavicular   Basal cell carcinoma 12/29/2016   left mid lat forearm   Basal cell carcinoma 05/10/2016   right distal  pretibial   Basal cell carcinoma 02/05/2016   left mid pretibial, left mid distal pretibial, left mid distal pretibial above ankle   Basal cell carcinoma 11/17/2015   right mid lat pretibial sup, right mid lat pretibial inf   Breast cancer (HCC)    Cancer of central portion of female breast, right 10/10/2015   Depression    GERD (gastroesophageal reflux disease)    H/O bone density study 1993   H/O colonoscopy 2012   Hyperlipemia    Osteopenia    of neck and left femur   SCC (squamous cell carcinoma) 07/27/2023   SCC IS, mid back left paraspinal . 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   right side burn preauricular, need EDC   Wears glasses    Wears hearing aid    bilateral   Past Surgical History:  Procedure Laterality Date   ABDOMINAL HYSTERECTOMY     part   BREAST DUCTAL SYSTEM EXCISION Left 06/20/2014   Procedure: LEFT BREAST CENTRAL DUCT EXCISION;  Surgeon: Debby Shipper, MD;  Location: [facility];  Service: General;  Laterality: Left;   BREAST LUMPECTOMY WITH RADIOACTIVE SEED LOCALIZATION Right 10/22/2015   Procedure: BREAST LUMPECTOMY WITH RADIOACTIVE SEED LOCALIZATION;  Surgeon: Donnice Bury, MD;  Location: [facility];  Service: General;  Laterality: Right;   CATARACT EXTRACTION W/PHACO Right 10/05/2017   Procedure: CATARACT EXTRACTION PHACO AND INTRAOCULAR LENS PLACEMENT (IOC) RIGHT;  Surgeon: Mittie Gaskin, MD;  Location: [facility];  Service: Ophthalmology;  Laterality: Right;   CATARACT EXTRACTION W/PHACO Left  11/02/2017   Procedure: CATARACT EXTRACTION PHACO AND INTRAOCULAR LENS PLACEMENT (IOC) LEFT;  Surgeon: Mittie Gaskin, MD;  Location: [facility];  Service: Ophthalmology;  Laterality: Left;  PATIENT WANTS EARLY ARRIVAL TIME   COLONOSCOPY     COLONOSCOPY WITH PROPOFOL  N/A 11/27/2021   Procedure: COLONOSCOPY WITH PROPOFOL ;  Surgeon: [name], MD;   Location: [facility];  Service: Endoscopy;  Laterality: N/A;   FOOT SURGERY     HAND SURGERY     TONSILLECTOMY     TUBAL LIGATION     WISDOM TOOTH EXTRACTION     Social History:  reports that she has never smoked. She has never used smokeless tobacco. She reports current alcohol use of about 2.0 standard drinks of alcohol per week. She reports that she does not use drugs.  Allergies  Allergen Reactions   Fosamax  [Alendronate ] Other (See Comments)    Family History  Problem Relation Age of Onset   Pancreatic cancer Mother 19   Liver cancer Mother    Breast cancer Maternal Aunt 81   Breast cancer Paternal Aunt 44    Prior to Admission medications   Medication Sig Start Date End Date Taking? Authorizing Provider  HYDROcodone -acetaminophen  (NORCO/VICODIN) 5-325 MG tablet Take 1 tablet by mouth every 6 (six) hours as needed. 04/16/24  Yes [provider]  tiZANidine (ZANAFLEX) 4 MG tablet Take 4 mg by mouth every 6 (six) hours as needed. 04/16/24  Yes [provider]  acetaminophen  (TYLENOL ) 500 MG tablet Take 500 mg by mouth as needed. Reported on 11/18/2015    [provider]  BACILLUS COAGULANS-INULIN PO Take by mouth.    [provider]  cholecalciferol (VITAMIN D3) 25 MCG (1000 UNIT) tablet Take 1 tablet (1,000 Units total) by mouth daily. 02/21/20   Odean Potts, MD  docusate sodium  (COLACE) 100 MG capsule Take 100 mg by mouth daily. Takes 2 capsules at bedtime, generic brand    [provider]  escitalopram  (LEXAPRO ) 20 MG tablet Take 20 mg by mouth daily.    [provider]  esomeprazole (NEXIUM) 40 MG capsule Take 40 mg by mouth daily at 12 noon.    [provider]  estradiol  (ESTRACE ) 0.1 MG/GM vaginal cream Estrogen Cream Instruction Discard applicator Apply pea sized amount to tip of finger to urethra before bed. Wash hands well after application. Use Monday, Wednesday and Friday 12/14/23   Penne Knee, MD   fluticasone (FLONASE) 50 MCG/ACT nasal spray Place into both nostrils daily as needed. Reported on 10/15/2015    [provider]  gabapentin (NEURONTIN) 300 MG capsule TAKE 1 CAPSULE BY MOUTH THREE TIMES A DAY FOR 30 DAYS 01/04/24   [provider]  ibuprofen (ADVIL,MOTRIN) 200 MG tablet Take 200 mg by mouth as needed. Reported on 02/26/2016    [provider]  montelukast  (SINGULAIR ) 10 MG tablet Take 10 mg by mouth at bedtime.    [provider]  mupirocin  ointment (BACTROBAN ) 2 % Apply to aa's BID PRN flares. 12/17/20   Hester Alm BROCKS, MD  nitrofurantoin  (MACRODANTIN ) 50 MG capsule Take 1 capsule (50 mg total) by mouth daily. 04/13/24   Vaillancourt, Samantha, PA-C  rosuvastatin  (CRESTOR ) 20 MG tablet Take 20 mg by mouth daily.    [provider]    Physical Exam: Vitals:   04/18/24 2324 04/18/24 2330 04/19/24 0030 04/19/24 0051  BP:  (!) 119/55  (!) 99/51  Pulse:  74 72 69  Resp:  14 17 12  Temp:      TempSrc:      SpO2: 98% 97% 98% 98%  Weight:      Height:       Physical Exam Vitals and nursing note reviewed.  Constitutional:      General: She is not in acute distress. HENT:     Head: Normocephalic and atraumatic.  Cardiovascular:     Rate and Rhythm: Normal rate and regular rhythm.     Heart sounds: Normal heart sounds.  Pulmonary:     Effort: Pulmonary effort is normal.     Breath sounds: Normal breath sounds.  Abdominal:     Palpations: Abdomen is soft.     Tenderness: There is no abdominal tenderness.  Neurological:     Mental Status: Mental status is at baseline.     Labs on Admission: I have personally reviewed following labs and imaging studies  CBC: Recent Labs  Lab 04/18/24 2111  WBC 8.1  NEUTROABS 6.4  HGB 13.1  HCT 40.3  MCV 90.8  PLT 178   Basic Metabolic Panel: Recent Labs  Lab 04/18/24 2111  NA 139  K 3.6  CL 104  CO2 24  GLUCOSE 117*  BUN 15  CREATININE 0.83  CALCIUM  9.1    GFR: Estimated Creatinine Clearance: 54.5 mL/min (by C-G formula based on SCr of 0.83 mg/dL). Liver Function Tests: Recent Labs  Lab 04/18/24 2111  AST 13*  ALT 13  ALKPHOS 78  BILITOT 0.9  PROT 6.6  ALBUMIN 3.4*   Recent Labs  Lab 04/18/24 2111  LIPASE 24   No results for input(s): AMMONIA in the last 168 hours. Coagulation Profile: No results for input(s): INR, PROTIME in the last 168 hours. Cardiac Enzymes: No results for input(s): CKTOTAL, CKMB, CKMBINDEX, TROPONINI in the last 168 hours. BNP (last 3 results) No results for input(s): PROBNP in the last 8760 hours. HbA1C: No results for input(s): HGBA1C in the last 72 hours. CBG: No results for input(s): GLUCAP in the last 168 hours. Lipid Profile: No results for input(s): CHOL, HDL, LDLCALC, TRIG, CHOLHDL, LDLDIRECT in the last 72 hours. Thyroid Function Tests: No results for input(s): TSH, T4TOTAL, FREET4, T3FREE, THYROIDAB in the last 72 hours. Anemia Panel: No results for input(s): VITAMINB12, FOLATE, FERRITIN, TIBC, IRON, RETICCTPCT in the last 72 hours. Urine analysis:    Component Value Date/Time   COLORURINE YELLOW (A) 04/18/2024 2301   APPEARANCEUR CLEAR (A) 04/18/2024 2301   APPEARANCEUR Clear 02/14/2024 1503   LABSPEC >1.046 (H) 04/18/2024 2301   PHURINE 5.0 04/18/2024 2301   GLUCOSEU NEGATIVE 04/18/2024 2301   HGBUR SMALL (A) 04/18/2024 2301   BILIRUBINUR NEGATIVE 04/18/2024 2301   BILIRUBINUR Negative 02/14/2024 1503   KETONESUR 5 (A) 04/18/2024 2301   PROTEINUR NEGATIVE 04/18/2024 2301   NITRITE NEGATIVE 04/18/2024 2301   LEUKOCYTESUR NEGATIVE 04/18/2024 2301    Radiological Exams on Admission: US  Venous Img Lower Bilateral (DVT) Result Date: 04/19/2024 EXAM: ULTRASOUND DUPLEX OF THE BILATERAL LOWER EXTREMITY VEINS TECHNIQUE: Duplex ultrasound using B-mode/gray scaled imaging and Doppler spectral analysis and color flow was obtained of  the deep venous structures of the bilateral lower extremity. COMPARISON: None. CLINICAL HISTORY: [id number] Pulmonary embolism (HCC) [id number]. Pulmonary embolism (HCC). FINDINGS: The visualized veins of the lower extremity are patent and free of echogenic thrombus. The veins demonstrate good compressibility with normal color flow study and spectral analysis. IMPRESSION: 1. No evidence of DVT. Electronically signed by: Norman Gatlin MD 04/19/2024 12:53 AM EDT RP Workstation: [id number]  CT Angio Chest PE W and/or Wo Contrast Result Date: 04/18/2024 CLINICAL DATA:  Left flank pain since yesterday, pain with inspiration EXAM: CT ANGIOGRAPHY CHEST WITH CONTRAST TECHNIQUE: Multidetector CT imaging of the chest was performed using the standard protocol during bolus administration of intravenous contrast. Multiplanar CT image reconstructions and MIPs were obtained to evaluate the vascular anatomy. RADIATION DOSE REDUCTION: This exam was performed according to the departmental dose-optimization program which includes automated exposure control, adjustment of the mA and/or kV according to patient size and/or use of iterative reconstruction technique. CONTRAST:  75mL OMNIPAQUE  IOHEXOL  350 MG/ML SOLN COMPARISON:  04/18/2024 FINDINGS: Cardiovascular: This is a technically adequate evaluation of the pulmonary vasculature. The acute bilateral lower lobe pulmonary emboli seen on earlier abdominal CT are again identified. No evidence of right heart strain. The heart is unremarkable without pericardial effusion. No evidence of thoracic aortic aneurysm or dissection. Mediastinum/Nodes: No enlarged mediastinal, hilar, or axillary lymph nodes. Thyroid gland, trachea, and esophagus demonstrate no significant findings. Small hiatal hernia. Lungs/Pleura: Bibasilar hypoventilatory changes. Trace left pleural effusion. No airspace disease or pneumothorax. Central airways are patent. Upper Abdomen: No acute abnormality. Musculoskeletal: No  acute or destructive bony abnormalities. Reconstructed images demonstrate no additional findings. Review of the MIP images confirms the above findings. IMPRESSION: 1. Bilateral lower lobe pulmonary emboli as seen on earlier abdominal CT. No right heart strain. 2. Trace left pleural effusion. 3. Small hiatal hernia. Electronically Signed   By: Ozell Daring M.D.   On: 04/18/2024 22:49   CT ABDOMEN PELVIS W CONTRAST Result Date: 04/18/2024 CLINICAL DATA:  Left lower quadrant pain EXAM: CT ABDOMEN AND PELVIS WITH CONTRAST TECHNIQUE: Multidetector CT imaging of the abdomen and pelvis was performed using the standard protocol following bolus administration of intravenous contrast. RADIATION DOSE REDUCTION: This exam was performed according to the departmental dose-optimization program which includes automated exposure control, adjustment of the mA and/or kV according to patient size and/or use of iterative reconstruction technique. CONTRAST:  OMNIPAQUE  IOHEXOL  300 MG/ML  SOLN COMPARISON:  CT 01/10/2024 FINDINGS: Lower chest: Lung bases demonstrate mild left pleural effusion and thickening. Atelectasis at the lower lobes. Partial and slightly bandlike left lower lobe consolidation. Incompletely visualized filling defects within the left greater than right lower lobe pulmonary arteries consistent with acute pulmonary emboli. Small hiatal hernia Hepatobiliary: Hepatic steatosis. No calcified gallstone or biliary dilatation Pancreas: Unremarkable. No pancreatic ductal dilatation or surrounding inflammatory changes. Spleen: Normal in size without focal abnormality. Adrenals/Urinary Tract: Adrenal glands are normal. Kidneys show no hydronephrosis. The bladder is unremarkable Stomach/Bowel: Stomach is within normal limits. Appendix appears normal. No evidence of bowel wall thickening, distention, or inflammatory changes. Vascular/Lymphatic: Aortic atherosclerosis. No enlarged abdominal or pelvic lymph nodes.  Reproductive: Status post hysterectomy. No adnexal masses. Other: Negative for free air. No ascites. Small fat containing umbilical hernia Musculoskeletal: Posterior lumbar spinal fusion hardware, inter body device and laminectomy changes at L1-L2. Limited assessment due to hardware artifact. Some stranding within the posterior subcutaneous paraspinal tissues likely operative in nature, no gross rim enhancing fluid collections. IMPRESSION: 1. Incompletely visualized filling defects within the left greater than right lower lobe pulmonary arteries consistent with acute pulmonary emboli. 2. Mild left pleural effusion and thickening with atelectasis at the lower lobes. Partial and slightly bandlike left lower lobe consolidation with hazy posterior density, favor atelectasis with component of pulmonary infarct not excluded. 3. No CT evidence for acute intra-abdominal or pelvic abnormality. 4. Hepatic steatosis. 5. Posterior lumbar spinal fusion hardware, inter  body device and laminectomy changes at L1-L2. Limited assessment due to hardware artifact. Some stranding within the posterior subcutaneous paraspinal tissues likely operative in nature, no gross rim enhancing fluid collections. 6. Aortic atherosclerosis. Critical Value/emergent results were called by telephone at the time of interpretation on 04/18/2024 at 10:19 pm to provider PHILLIP STAFFORD , who verbally acknowledged these results. Aortic Atherosclerosis (ICD10-I70.0). Electronically Signed   By: Luke Bun M.D.   On: 04/18/2024 22:19   Data Reviewed for HPI: Relevant notes from primary care and specialist visits, past discharge summaries as available in EHR, including Care Everywhere. Prior diagnostic testing as pertinent to current admission diagnoses Updated medications and problem lists for reconciliation ED course, including vitals, labs, imaging, treatment and response to treatment Triage notes, nursing and pharmacy notes and ED provider's  notes Notable results as noted above in HPI      Assessment and Plan: * Bilateral pulmonary embolism (HCC) Suspect provoked from recent orthopedic surgery Stable without tachycardia or hypoxia Patient was on 2 baby aspirin daily postop for DVT prophylaxis Follow-up lower extremity venous ultrasound Continue heparin  transfusion and transition to oral AC once stable Follow-up echocardiogram No heart strain was seen on CT  Chronic right shoulder pain Receives subacromial injections  History of lumbar spinal fusion No acute issues suspected Patient started PT the day prior on 9/10 Can continue PT while in house  History of right breast cancer No acute issues suspected        DVT prophylaxis: Heparin  full dose  Consults: none  Advance Care Planning: full code  Family Communication: none  Disposition Plan: Back to previous home environment  Severity of Illness: The appropriate patient status for this patient is OBSERVATION. Observation status is judged to be reasonable and necessary in order to provide the required intensity of service to ensure the patient's safety. The patient's presenting symptoms, physical exam findings, and initial radiographic and laboratory data in the context of their medical condition is felt to place them at decreased risk for further clinical deterioration. Furthermore, it is anticipated that the patient will be medically stable for discharge from the hospital within 2 midnights of admission.   Author: Delayne LULLA Solian, MD 04/19/2024 1:00 AM  For on call review [email or web address].

## 2024-04-19 NOTE — Assessment & Plan Note (Addendum)
 Suspect provoked from recent orthopedic surgery Stable without tachycardia or hypoxia Patient was on 2 baby aspirin daily postop for DVT prophylaxis Follow-up lower extremity venous ultrasound Continue heparin  transfusion and transition to oral AC once stable Follow-up echocardiogram No heart strain was seen on CT

## 2024-04-19 NOTE — Discharge Summary (Signed)
 Physician Discharge Summary  Patient: Melody Harding FMW:[id number] DOB: 1951/05/03   Code Status: Full Code Admit date: 04/18/2024 Discharge date: 04/19/2024 Disposition: Home, No home health services recommended PCP: Alla Amis, MD  Recommendations for Outpatient Follow-up:  Follow up with PCP within 1-2 weeks Regarding general hospital follow up and preventative care Recommend discussing appropriate length of treatment on eliquis    Discharge Diagnoses:  Principal Problem:   Bilateral pulmonary embolism (HCC) Active Problems:   History of right breast cancer   History of lumbar spinal fusion   Chronic right shoulder pain   Acute respiratory failure with hypoxia [facility])  Brief Hospital Course Summary: Melody Harding is a 73 y.o. female with medical history significant for Right breast cancer, s/p treatment 8 years ago, back surgery 6 weeks ago, chronic shoulder pain with subacromial injections, being admitted for bilateral PE.  She presented with left-sided flank pain worse with deep inspiration and shortness of breath, which developed following her first postoperative physical therapy session earlier in the day.  She stated that since her back surgery she has not been moving around a lot and following her first physical therapy session she felt very exhausted and short winded.  Also mentioned that she developed lower extremity cramping pain of the left leg about a week following the surgery and was treated with prednisone.  She denies chest pain or lower extremity swelling. In the ED, vitals were within normal limits. Labs were unremarkable and included CBC, CMP, lipase, troponin and UA. EKG not done. CT abdomen and pelvis with no acute intra-abdominal or pelvic abnormality and no acute concerns related to lumbar spinal fusion hardware (please see report).  Did show some filling defects lung bases and follow-up CTA chest showed bilateral lower lobe emboli without heart  strain. Patient started on a heparin  infusion with bolus.  Morphine  administered for pain.  Echo without concern for heart strain.  Heparin  was discontinued and transitioned to loading dose of eliquis .  She was stable ORA even with ambulation. Able to be discharged in stable condition.   All other chronic conditions were treated with home medications.    Discharge Condition: Good, improved Recommended discharge diet: Regular healthy diet  Consultations: None   Procedures/Studies: Echo   Allergies as of 04/19/2024       Reactions   Fosamax  [alendronate ] Other (See Comments)        Medication List     STOP taking these medications    docusate sodium  100 MG capsule Commonly known as: COLACE       TAKE these medications    acetaminophen  500 MG tablet Commonly known as: TYLENOL  Take 500 mg by mouth every 6 (six) hours as needed. Reported on 11/18/2015   Apixaban  Starter Pack (10mg  and 5mg ) Commonly known as: ELIQUIS  STARTER PACK Take as directed on package: start with two-5mg  tablets twice daily for 7 days. On day 8, switch to one-5mg  tablet twice daily. Start taking on: April 20, 2024   BACILLUS COAGULANS-INULIN PO Take 1 capsule by mouth daily.   cholecalciferol 25 MCG (1000 UNIT) tablet Commonly known as: VITAMIN D3 Take 1 tablet (1,000 Units total) by mouth daily.   Cranberry 500 MG Tabs Take 1 tablet by mouth daily.   escitalopram  20 MG tablet Commonly known as: LEXAPRO  Take 20 mg by mouth daily.   esomeprazole 40 MG capsule Commonly known as: NEXIUM Take 40 mg by mouth daily at 12 noon.   estradiol  0.1 MG/GM vaginal cream Commonly known  as: ESTRACE  Estrogen Cream Instruction Discard applicator Apply pea sized amount to tip of finger to urethra before bed. Wash hands well after application. Use Monday, Wednesday and Friday   fluticasone 50 MCG/ACT nasal spray Commonly known as: FLONASE Place into both nostrils daily as needed. Reported on  10/15/2015   gabapentin 300 MG capsule Commonly known as: NEURONTIN TAKE 1 CAPSULE BY MOUTH THREE TIMES A DAY FOR 30 DAYS   HYDROcodone -acetaminophen  5-325 MG tablet Commonly known as: NORCO/VICODIN Take 1 tablet by mouth every 6 (six) hours as needed.   montelukast  10 MG tablet Commonly known as: SINGULAIR  Take 10 mg by mouth at bedtime.   mupirocin  ointment 2 % Commonly known as: BACTROBAN  Apply to aa's BID PRN flares.   nitrofurantoin  50 MG capsule Commonly known as: MACRODANTIN  Take 1 capsule (50 mg total) by mouth daily.   PREVAGEN PO Take 1 capsule by mouth daily.   rosuvastatin  20 MG tablet Commonly known as: CRESTOR  Take 30 mg by mouth daily.   tiZANidine 4 MG tablet Commonly known as: ZANAFLEX Take 4 mg by mouth every 6 (six) hours as needed.        Follow-up Information     Alla Amis, MD. Schedule an appointment as soon as possible for a visit in 1 week(s).   Specialty: Family Medicine Contact information: [address] [place] [address] [phone]                Subjective   Pt reports feeling some mild nausea with breakfast. Denies chest pain or SOB. She has been able to tolerate her breakfast and lunch without vomiting. Ambulating without concern.   All questions and concerns were addressed at time of discharge.  Objective  Blood pressure (!) 134/54, pulse 74, temperature 98.3 F (36.8 C), temperature source Oral, resp. rate 16, height 5' (1.524 m), weight 72.6 kg, SpO2 96%.   General: Pt is alert, awake, not in acute distress Cardiovascular: RRR, S1/S2 +, no rubs, no gallops Respiratory: CTA bilaterally, no wheezing, no rhonchi Abdominal: Soft, NT, ND, bowel sounds + Extremities: no edema, no cyanosis  The results of significant diagnostics from this hospitalization (including imaging, microbiology, ancillary and laboratory) are listed below for reference.   Imaging studies: ECHOCARDIOGRAM COMPLETE Result Date:  04/19/2024    ECHOCARDIOGRAM REPORT   Patient Name:   Melody Harding Date of Exam: 04/19/2024 Medical Rec #:  [id number]        Height:       60.0 in Accession #:    [id number]       Weight:       160.0 lb Date of Birth:  06-15-1951       BSA:          1.698 m Patient Age:    72 years         BP:           133/68 mmHg Patient Gender: F                HR:           72 bpm. Exam Location:  [facility] Procedure: 2D Echo, Cardiac Doppler, Color Doppler and Intracardiac            Opacification Agent (Both Spectral and Color Flow Doppler were            utilized during procedure). Indications:     Acutre respiratory distress R06.03  History:  Patient has no prior history of Echocardiogram examinations.                  Signs/Symptoms:Dyspnea.  Sonographer:     Rosina Dunk Referring Phys:  [id number] DELAYNE LULLA SOLIAN Diagnosing Phys: Evalene Lunger MD IMPRESSIONS  1. Left ventricular ejection fraction, by estimation, is 60 to 65%. The left ventricle has normal function. The left ventricle has no regional wall motion abnormalities. Left ventricular diastolic parameters were normal.  2. Right ventricular systolic function is normal. The right ventricular size is normal. Tricuspid regurgitation signal is inadequate for assessing PA pressure.  3. The mitral valve is normal in structure. No evidence of mitral valve regurgitation. No evidence of mitral stenosis.  4. The aortic valve is normal in structure. Aortic valve regurgitation is mild (possible mild to moderate). Aortic valve sclerosis/calcification is present, without any evidence of aortic stenosis.  5. The inferior vena cava is normal in size with greater than 50% respiratory variability, suggesting right atrial pressure of 3 mmHg. FINDINGS  Left Ventricle: Left ventricular ejection fraction, by estimation, is 60 to 65%. The left ventricle has normal function. The left ventricle has no regional wall motion abnormalities. Definity  contrast agent was given IV to  delineate the left ventricular  endocardial borders. Strain was performed and the global longitudinal strain is indeterminate. The left ventricular internal cavity size was normal in size. There is no left ventricular hypertrophy. Left ventricular diastolic parameters were normal. Right Ventricle: The right ventricular size is normal. No increase in right ventricular wall thickness. Right ventricular systolic function is normal. Tricuspid regurgitation signal is inadequate for assessing PA pressure. Left Atrium: Left atrial size was normal in size. Right Atrium: Right atrial size was normal in size. Pericardium: There is no evidence of pericardial effusion. Mitral Valve: The mitral valve is normal in structure. Mild mitral annular calcification. No evidence of mitral valve regurgitation. No evidence of mitral valve stenosis. MV peak gradient, 5.6 mmHg. The mean mitral valve gradient is 2.0 mmHg. Tricuspid Valve: The tricuspid valve is normal in structure. Tricuspid valve regurgitation is not demonstrated. No evidence of tricuspid stenosis. Aortic Valve: The aortic valve is normal in structure. Aortic valve regurgitation is mild. Aortic regurgitation PHT measures 300 msec. Aortic valve sclerosis/calcification is present, without any evidence of aortic stenosis. Aortic valve mean gradient measures 4.0 mmHg. Aortic valve peak gradient measures 8.0 mmHg. Aortic valve area, by VTI measures 2.97 cm. Pulmonic Valve: The pulmonic valve was normal in structure. Pulmonic valve regurgitation is not visualized. No evidence of pulmonic stenosis. Aorta: The aortic root is normal in size and structure. Venous: The inferior vena cava is normal in size with greater than 50% respiratory variability, suggesting right atrial pressure of 3 mmHg. IAS/Shunts: No atrial level shunt detected by color flow Doppler. Additional Comments: 3D was performed not requiring image post processing on an independent workstation and was normal.  LEFT  VENTRICLE PLAX 2D LVIDd:         4.50 cm     Diastology LVIDs:         2.50 cm     LV e' medial:    8.16 cm/s LV PW:         1.00 cm     LV E/e' medial:  10.7 LV IVS:        0.90 cm     LV e' lateral:   11.70 cm/s LVOT diam:     2.10 cm     LV E/e' lateral:  7.5 LV SV:         80 LV SV Index:   47 LVOT Area:     3.46 cm  LV Volumes (MOD) LV vol d, MOD A2C: 52.1 ml LV vol d, MOD A4C: 51.5 ml LV vol s, MOD A2C: 15.9 ml LV vol s, MOD A4C: 16.2 ml LV SV MOD A2C:     36.2 ml LV SV MOD A4C:     51.5 ml LV SV MOD BP:      35.5 ml RIGHT VENTRICLE RV Basal diam:  4.10 cm RV Mid diam:    3.00 cm RV S prime:     9.90 cm/s TAPSE (M-mode): 1.9 cm LEFT ATRIUM             Index        RIGHT ATRIUM           Index LA diam:        2.90 cm 1.71 cm/m   RA Area:     15.30 cm LA Vol (A2C):   37.8 ml 22.27 ml/m  RA Volume:   41.60 ml  24.50 ml/m LA Vol (A4C):   36.3 ml 21.38 ml/m LA Biplane Vol: 39.8 ml 23.44 ml/m  AORTIC VALVE                    PULMONIC VALVE AV Area (Vmax):    2.78 cm     PV Vmax:        0.96 m/s AV Area (Vmean):   2.79 cm     PV Vmean:       67.800 cm/s AV Area (VTI):     2.97 cm     PV VTI:         0.198 m AV Vmax:           141.00 cm/s  PV Peak grad:   3.7 mmHg AV Vmean:          94.500 cm/s  PV Mean grad:   2.0 mmHg AV VTI:            0.271 m      RVOT Peak grad: 3 mmHg AV Peak Grad:      8.0 mmHg AV Mean Grad:      4.0 mmHg LVOT Vmax:         113.00 cm/s LVOT Vmean:        76.000 cm/s LVOT VTI:          0.232 m LVOT/AV VTI ratio: 0.86 AI PHT:            300 msec  AORTA Ao Root diam: 2.70 cm Ao Asc diam:  2.60 cm MITRAL VALVE MV Area (PHT): 3.85 cm     SHUNTS MV Area VTI:   3.01 cm     Systemic VTI:  0.23 m MV Peak grad:  5.6 mmHg     Systemic Diam: 2.10 cm MV Mean grad:  2.0 mmHg     Pulmonic VTI:  0.189 m MV Vmax:       1.18 m/s MV Vmean:      67.3 cm/s MV Decel Time: 197 msec MV E velocity: 87.50 cm/s MV A velocity: 105.00 cm/s MV E/A ratio:  0.83 Evalene Lunger MD Electronically signed by Evalene Lunger MD Signature Date/Time: 04/19/2024/1:26:00 PM    Final (Updated)    US  Venous Img Lower Bilateral (DVT) Result Date: 04/19/2024 EXAM: ULTRASOUND DUPLEX OF THE BILATERAL LOWER EXTREMITY VEINS TECHNIQUE: Duplex ultrasound using  B-mode/gray scaled imaging and Doppler spectral analysis and color flow was obtained of the deep venous structures of the bilateral lower extremity. COMPARISON: None. CLINICAL HISTORY: 141700 Pulmonary embolism (HCC) [id number]. Pulmonary embolism (HCC). FINDINGS: The visualized veins of the lower extremity are patent and free of echogenic thrombus. The veins demonstrate good compressibility with normal color flow study and spectral analysis. IMPRESSION: 1. No evidence of DVT. Electronically signed by: Norman Gatlin MD 04/19/2024 12:53 AM EDT RP Workstation: [id number]   CT Angio Chest PE W and/or Wo Contrast Result Date: 04/18/2024 CLINICAL DATA:  Left flank pain since yesterday, pain with inspiration EXAM: CT ANGIOGRAPHY CHEST WITH CONTRAST TECHNIQUE: Multidetector CT imaging of the chest was performed using the standard protocol during bolus administration of intravenous contrast. Multiplanar CT image reconstructions and MIPs were obtained to evaluate the vascular anatomy. RADIATION DOSE REDUCTION: This exam was performed according to the departmental dose-optimization program which includes automated exposure control, adjustment of the mA and/or kV according to patient size and/or use of iterative reconstruction technique. CONTRAST:  75mL OMNIPAQUE  IOHEXOL  350 MG/ML SOLN COMPARISON:  04/18/2024 FINDINGS: Cardiovascular: This is a technically adequate evaluation of the pulmonary vasculature. The acute bilateral lower lobe pulmonary emboli seen on earlier abdominal CT are again identified. No evidence of right heart strain. The heart is unremarkable without pericardial effusion. No evidence of thoracic aortic aneurysm or dissection. Mediastinum/Nodes: No enlarged mediastinal, hilar,  or axillary lymph nodes. Thyroid gland, trachea, and esophagus demonstrate no significant findings. Small hiatal hernia. Lungs/Pleura: Bibasilar hypoventilatory changes. Trace left pleural effusion. No airspace disease or pneumothorax. Central airways are patent. Upper Abdomen: No acute abnormality. Musculoskeletal: No acute or destructive bony abnormalities. Reconstructed images demonstrate no additional findings. Review of the MIP images confirms the above findings. IMPRESSION: 1. Bilateral lower lobe pulmonary emboli as seen on earlier abdominal CT. No right heart strain. 2. Trace left pleural effusion. 3. Small hiatal hernia. Electronically Signed   By: Ozell Daring M.D.   On: 04/18/2024 22:49   CT ABDOMEN PELVIS W CONTRAST Result Date: 04/18/2024 CLINICAL DATA:  Left lower quadrant pain EXAM: CT ABDOMEN AND PELVIS WITH CONTRAST TECHNIQUE: Multidetector CT imaging of the abdomen and pelvis was performed using the standard protocol following bolus administration of intravenous contrast. RADIATION DOSE REDUCTION: This exam was performed according to the departmental dose-optimization program which includes automated exposure control, adjustment of the mA and/or kV according to patient size and/or use of iterative reconstruction technique. CONTRAST:  OMNIPAQUE  IOHEXOL  300 MG/ML  SOLN COMPARISON:  CT 01/10/2024 FINDINGS: Lower chest: Lung bases demonstrate mild left pleural effusion and thickening. Atelectasis at the lower lobes. Partial and slightly bandlike left lower lobe consolidation. Incompletely visualized filling defects within the left greater than right lower lobe pulmonary arteries consistent with acute pulmonary emboli. Small hiatal hernia Hepatobiliary: Hepatic steatosis. No calcified gallstone or biliary dilatation Pancreas: Unremarkable. No pancreatic ductal dilatation or surrounding inflammatory changes. Spleen: Normal in size without focal abnormality. Adrenals/Urinary Tract: Adrenal  glands are normal. Kidneys show no hydronephrosis. The bladder is unremarkable Stomach/Bowel: Stomach is within normal limits. Appendix appears normal. No evidence of bowel wall thickening, distention, or inflammatory changes. Vascular/Lymphatic: Aortic atherosclerosis. No enlarged abdominal or pelvic lymph nodes. Reproductive: Status post hysterectomy. No adnexal masses. Other: Negative for free air. No ascites. Small fat containing umbilical hernia Musculoskeletal: Posterior lumbar spinal fusion hardware, inter body device and laminectomy changes at L1-L2. Limited assessment due to hardware artifact. Some stranding within the posterior subcutaneous paraspinal tissues likely operative  in nature, no gross rim enhancing fluid collections. IMPRESSION: 1. Incompletely visualized filling defects within the left greater than right lower lobe pulmonary arteries consistent with acute pulmonary emboli. 2. Mild left pleural effusion and thickening with atelectasis at the lower lobes. Partial and slightly bandlike left lower lobe consolidation with hazy posterior density, favor atelectasis with component of pulmonary infarct not excluded. 3. No CT evidence for acute intra-abdominal or pelvic abnormality. 4. Hepatic steatosis. 5. Posterior lumbar spinal fusion hardware, inter body device and laminectomy changes at L1-L2. Limited assessment due to hardware artifact. Some stranding within the posterior subcutaneous paraspinal tissues likely operative in nature, no gross rim enhancing fluid collections. 6. Aortic atherosclerosis. Critical Value/emergent results were called by telephone at the time of interpretation on 04/18/2024 at 10:19 pm to provider PHILLIP STAFFORD , who verbally acknowledged these results. Aortic Atherosclerosis (ICD10-I70.0). Electronically Signed   By: Luke Bun M.D.   On: 04/18/2024 22:19    Labs: Basic Metabolic Panel: Recent Labs  Lab 04/18/24 2111  NA 139  K 3.6  CL 104  CO2 24  GLUCOSE  117*  BUN 15  CREATININE 0.83  CALCIUM  9.1   CBC: Recent Labs  Lab 04/18/24 2111  WBC 8.1  NEUTROABS 6.4  HGB 13.1  HCT 40.3  MCV 90.8  PLT 178   Microbiology: Results for orders placed or performed in visit on 02/14/24  Microscopic Examination     Status: None   Collection Time: 02/14/24  3:03 PM   Urine  Result Value Ref Range Status   WBC, UA 0-5 0 - 5 /hpf Final   RBC, Urine None seen 0 - 2 /hpf Final   Epithelial Cells (non renal) 0-10 0 - 10 /hpf Final   Bacteria, UA None seen None seen/Few Final    Time coordinating discharge: Over 30 minutes  Marien LITTIE [name], MD  [facility] 04/19/2024, 2:14 PM

## 2024-04-19 NOTE — Assessment & Plan Note (Signed)
 No acute issues suspected

## 2024-04-19 NOTE — Progress Notes (Signed)
  INTERVAL PROGRESS NOTE  Melody PILTZ- 73 y.o. female  LOS: 0 __________________________________________________________________  SUBJECTIVE: Admitted 04/18/2024 with cc of  Chief Complaint  Patient presents with   Flank Pain   Since admission, patient had normal echo and weaned from oxygen. O2 remained >95% while ambulating ORA. DVT study negative. She was transitioned from heparin  gtt to eliquis .  She developed nausea prior taking her eliquis  which was refractory to zofran  and compazine . She was able to take the eliquis  and did not have vomiting.  She was discharged but due to her nausea, she did not feel up to going home so dc was cancelled.   OBJECTIVE: Blood pressure (!) 154/68, pulse 86, temperature 100.3 F (37.9 C), resp. rate 16, height 5' (1.524 m), weight 72.6 kg, SpO2 96%.  General: NAD, pleasant, able to participate in exam Cardiac: RRR, normal heart sounds, no murmurs. 2+ radial and PT pulses bilaterally Respiratory: CTAB, normal effort, No wheezes, rales or rhonchi Extremities: no edema. WWP. Skin: warm and dry, no rashes noted Neuro: alert and oriented, no focal deficits Psych: Normal affect and mood   ASSESSMENT/PLAN:  I have reviewed the full H&P by Dr. Cleatus, and I reviewed pertinent results.  In addition: She was started on IV fluids and PRN antiemetics ordered.  Expect early dc tomorrow.    Principal Problem:   Bilateral pulmonary embolism (HCC) Active Problems:   History of right breast cancer   History of lumbar spinal fusion   Chronic right shoulder pain   Acute respiratory failure with hypoxia (HCC)   Melody LITTIE [name], DO [facility] 04/19/2024, 7:33 PM    [email or web address] Available by Epic secure chat 7AM-7PM. If 7PM-7AM, please contact night-coverage   No Charge

## 2024-04-19 NOTE — ED Notes (Signed)
 Ambulatory O2 sat on room air is 95%, pt states that she feels very weak and shaky, pt states that she doesn't feel well enough to go home.

## 2024-04-19 NOTE — ED Notes (Signed)
 Pt continues to c/o nausa, nausea med given, pt states that she is also having some abd pain, pain med given, sig other at bedside. Resps even and unlabored.

## 2024-04-19 NOTE — Discharge Instructions (Addendum)
 Please follow up with your primary doctor within 1-2 weeks to discuss your recovery.  You have been started on a blood thinner called eliquis . You will take a double dose for a week (two pills in the morning and two at night) followed by regular dosing which is once in the morning and one at night.  You will have to be on this for 3 months at least and you can discuss with your primary doctor if you can discontinue it at that time.   Information on my medicine - ELIQUIS  (apixaban )  This medication education was reviewed with me or my healthcare representative as part of my discharge preparation.   Why was Eliquis  prescribed for you? Eliquis  was prescribed to treat blood clots that may have been found in the veins of your legs (deep vein thrombosis) or in your lungs (pulmonary embolism) and to reduce the risk of them occurring again.  What do You need to know about Eliquis  ? The starting dose is 10 mg (two 5 mg tablets) taken TWICE daily for the FIRST SEVEN (7) DAYS, then on (enter date)  04/26/24  the dose is reduced to ONE 5 mg tablet taken TWICE daily.  Eliquis  may be taken with or without food.   Try to take the dose about the same time in the morning and in the evening. If you have difficulty swallowing the tablet whole please discuss with your pharmacist how to take the medication safely.  Take Eliquis  exactly as prescribed and DO NOT stop taking Eliquis  without talking to the doctor who prescribed the medication.  Stopping may increase your risk of developing a new blood clot.  Refill your prescription before you run out.  After discharge, you should have regular check-up appointments with your healthcare provider that is prescribing your Eliquis .    What do you do if you miss a dose? If a dose of ELIQUIS  is not taken at the scheduled time, take it as soon as possible on the same day and twice-daily administration should be resumed. The dose should not be doubled to make up for a  missed dose.  Important Safety Information A possible side effect of Eliquis  is bleeding. You should call your healthcare provider right away if you experience any of the following: Bleeding from an injury or your nose that does not stop. Unusual colored urine (red or dark brown) or unusual colored stools (red or black). Unusual bruising for unknown reasons. A serious fall or if you hit your head (even if there is no bleeding).  Some medicines may interact with Eliquis  and might increase your risk of bleeding or clotting while on Eliquis . To help avoid this, consult your healthcare provider or pharmacist prior to using any new prescription or non-prescription medications, including herbals, vitamins, non-steroidal anti-inflammatory drugs (NSAIDs) and supplements.  This website has more information on Eliquis  (apixaban ): [email or web address] .com/eliquis dena

## 2024-04-19 NOTE — Assessment & Plan Note (Signed)
 Receives subacromial injections

## 2024-04-20 ENCOUNTER — Other Ambulatory Visit: Payer: Self-pay

## 2024-04-20 DIAGNOSIS — I2699 Other pulmonary embolism without acute cor pulmonale: Secondary | ICD-10-CM | POA: Diagnosis not present

## 2024-04-20 DIAGNOSIS — J9601 Acute respiratory failure with hypoxia: Secondary | ICD-10-CM | POA: Diagnosis not present

## 2024-04-20 LAB — CBC
HCT: 35.8 % — ABNORMAL LOW (ref 36.0–46.0)
Hemoglobin: 11.6 g/dL — ABNORMAL LOW (ref 12.0–15.0)
MCH: 29.4 pg (ref 26.0–34.0)
MCHC: 32.4 g/dL (ref 30.0–36.0)
MCV: 90.6 fL (ref 80.0–100.0)
Platelets: 190 K/uL (ref 150–400)
RBC: 3.95 MIL/uL (ref 3.87–5.11)
RDW: 13.8 % (ref 11.5–15.5)
WBC: 8.5 K/uL (ref 4.0–10.5)
nRBC: 0 % (ref 0.0–0.2)

## 2024-04-20 MED ORDER — PROCHLORPERAZINE EDISYLATE 10 MG/2ML IJ SOLN
10.0000 mg | Freq: Once | INTRAMUSCULAR | Status: AC
  Administered 2024-04-20: 10 mg via INTRAVENOUS
  Filled 2024-04-20: qty 2

## 2024-04-20 MED ORDER — METOCLOPRAMIDE HCL 10 MG PO TABS
10.0000 mg | ORAL_TABLET | Freq: Three times a day (TID) | ORAL | 0 refills | Status: AC | PRN
Start: 2024-04-20 — End: 2024-05-05
  Filled 2024-04-20: qty 20, 7d supply, fill #0

## 2024-04-20 MED ORDER — METOCLOPRAMIDE HCL 10 MG PO TABS
10.0000 mg | ORAL_TABLET | Freq: Three times a day (TID) | ORAL | 0 refills | Status: DC | PRN
Start: 2024-04-20 — End: 2024-04-20

## 2024-04-20 MED ORDER — APIXABAN (ELIQUIS) VTE STARTER PACK (10MG AND 5MG)
ORAL_TABLET | ORAL | 0 refills | Status: AC
  Filled 2024-04-20: qty 74, 30d supply, fill #0

## 2024-04-20 NOTE — Progress Notes (Incomplete)
 PROGRESS NOTE  KERRINGTON SOVA    DOB: 06/21/1951, 73 y.o.  FMW:[id number]    Code Status: Full Code   DOA: 04/18/2024   LOS: 0   Brief hospital course  ISABELLAH SOBOCINSKI is a 73 y.o. female with a PMH significant for ***.  They presented from *** to the ED on 04/18/2024 with *** x *** days. ***  In the ED, it was found that they had ***.  Significant findings included ***.  They were initially treated with ***.   Patient was admitted to medicine service for further workup and management of *** as outlined in detail below.  04/20/24 -***  Assessment & Plan  Principal Problem:   Bilateral pulmonary embolism (HCC) Active Problems:   History of right breast cancer   History of lumbar spinal fusion   Chronic right shoulder pain   Acute respiratory failure with hypoxia (HCC)  *** -   *** -   *** -   *** -   *** -   Body mass index is 31.25 kg/m.  VTE ppx:  apixaban  (ELIQUIS ) tablet 10 mg  apixaban  (ELIQUIS ) tablet 5 mg   Diet:     Diet   Diet Heart Room service appropriate? Yes; Fluid consistency: Thin   Consultants: ***  Subjective 04/20/24    Pt reports ***   Objective  Blood pressure (!) 131/58, pulse 81, temperature 98.6 F (37 C), resp. rate 14, height 5' (1.524 m), weight 72.6 kg, SpO2 95%.  Intake/Output Summary (Last 24 hours) at 04/20/2024 0803 Last data filed at 04/20/2024 0500 Gross per 24 hour  Intake 1111.13 ml  Output --  Net 1111.13 ml   Filed Weights   04/18/24 1844  Weight: 72.6 kg     Physical Exam: *** General: awake, alert, NAD HEENT: atraumatic, clear conjunctiva, anicteric sclera, MMM, hearing grossly normal Respiratory: normal respiratory effort. Cardiovascular: quick capillary refill, normal S1/S2, RRR, no JVD, murmurs Gastrointestinal: soft, NT, ND Nervous: A&O x3. no gross focal neurologic deficits, normal speech Extremities: moves all equally, no edema, normal tone Skin: dry, intact, normal temperature, normal  color. No rashes, lesions or ulcers on exposed skin Psychiatry: normal mood, congruent affect  Labs   I have personally reviewed the following labs and imaging studies CBC    Component Value Date/Time   WBC 8.5 04/20/2024 0438   RBC 3.95 04/20/2024 0438   HGB 11.6 (L) 04/20/2024 0438   HGB 13.9 10/15/2015 1220   HCT 35.8 (L) 04/20/2024 0438   HCT 42.5 10/15/2015 1220   PLT 190 04/20/2024 0438   PLT 265 10/15/2015 1220   MCV 90.6 04/20/2024 0438   MCV 88.1 10/15/2015 1220   MCH 29.4 04/20/2024 0438   MCHC 32.4 04/20/2024 0438   RDW 13.8 04/20/2024 0438   RDW 14.0 10/15/2015 1220   LYMPHSABS 0.8 04/18/2024 2111   LYMPHSABS 2.3 10/15/2015 1220   MONOABS 0.7 04/18/2024 2111   MONOABS 0.6 10/15/2015 1220   EOSABS 0.1 04/18/2024 2111   EOSABS 0.1 10/15/2015 1220   BASOSABS 0.0 04/18/2024 2111   BASOSABS 0.0 10/15/2015 1220      Latest Ref Rng & Units 04/18/2024    9:11 PM 06/29/2016    4:56 PM 10/15/2015   12:20 PM  BMP  Glucose 70 - 99 mg/dL 882  877  88   BUN 8 - 23 mg/dL 15  13  87.4   Creatinine 0.44 - 1.00 mg/dL 9.16  9.05  0.9   Sodium 135 -  145 mmol/L 139  138  138   Potassium 3.5 - 5.1 mmol/L 3.6  3.3  4.0   Chloride 98 - 111 mmol/L 104  105    CO2 22 - 32 mmol/L 24  21  27    Calcium  8.9 - 10.3 mg/dL 9.1  9.3  9.3     ECHOCARDIOGRAM COMPLETE Result Date: 04/19/2024    ECHOCARDIOGRAM REPORT   Patient Name:   Cassaundra A Meeuwsen Date of Exam: 04/19/2024 Medical Rec #:  [id number]        Height:       60.0 in Accession #:    [id number]       Weight:       160.0 lb Date of Birth:  08/31/1950       BSA:          1.698 m Patient Age:    72 years         BP:           133/68 mmHg Patient Gender: F                HR:           72 bpm. Exam Location:  [facility] Procedure: 2D Echo, Cardiac Doppler, Color Doppler and Intracardiac            Opacification Agent (Both Spectral and Color Flow Doppler were            utilized during procedure). Indications:     Acutre respiratory distress  R06.03  History:         Patient has no prior history of Echocardiogram examinations.                  Signs/Symptoms:Dyspnea.  Sonographer:     Rosina Dunk Referring Phys:  [id number] DELAYNE LULLA SOLIAN Diagnosing Phys: Evalene Lunger MD IMPRESSIONS  1. Left ventricular ejection fraction, by estimation, is 60 to 65%. The left ventricle has normal function. The left ventricle has no regional wall motion abnormalities. Left ventricular diastolic parameters were normal.  2. Right ventricular systolic function is normal. The right ventricular size is normal. Tricuspid regurgitation signal is inadequate for assessing PA pressure.  3. The mitral valve is normal in structure. No evidence of mitral valve regurgitation. No evidence of mitral stenosis.  4. The aortic valve is normal in structure. Aortic valve regurgitation is mild (possible mild to moderate). Aortic valve sclerosis/calcification is present, without any evidence of aortic stenosis.  5. The inferior vena cava is normal in size with greater than 50% respiratory variability, suggesting right atrial pressure of 3 mmHg. FINDINGS  Left Ventricle: Left ventricular ejection fraction, by estimation, is 60 to 65%. The left ventricle has normal function. The left ventricle has no regional wall motion abnormalities. Definity  contrast agent was given IV to delineate the left ventricular  endocardial borders. Strain was performed and the global longitudinal strain is indeterminate. The left ventricular internal cavity size was normal in size. There is no left ventricular hypertrophy. Left ventricular diastolic parameters were normal. Right Ventricle: The right ventricular size is normal. No increase in right ventricular wall thickness. Right ventricular systolic function is normal. Tricuspid regurgitation signal is inadequate for assessing PA pressure. Left Atrium: Left atrial size was normal in size. Right Atrium: Right atrial size was normal in size. Pericardium:  There is no evidence of pericardial effusion. Mitral Valve: The mitral valve is normal in structure. Mild mitral annular calcification. No evidence of mitral valve regurgitation. No evidence  of mitral valve stenosis. MV peak gradient, 5.6 mmHg. The mean mitral valve gradient is 2.0 mmHg. Tricuspid Valve: The tricuspid valve is normal in structure. Tricuspid valve regurgitation is not demonstrated. No evidence of tricuspid stenosis. Aortic Valve: The aortic valve is normal in structure. Aortic valve regurgitation is mild. Aortic regurgitation PHT measures 300 msec. Aortic valve sclerosis/calcification is present, without any evidence of aortic stenosis. Aortic valve mean gradient measures 4.0 mmHg. Aortic valve peak gradient measures 8.0 mmHg. Aortic valve area, by VTI measures 2.97 cm. Pulmonic Valve: The pulmonic valve was normal in structure. Pulmonic valve regurgitation is not visualized. No evidence of pulmonic stenosis. Aorta: The aortic root is normal in size and structure. Venous: The inferior vena cava is normal in size with greater than 50% respiratory variability, suggesting right atrial pressure of 3 mmHg. IAS/Shunts: No atrial level shunt detected by color flow Doppler. Additional Comments: 3D was performed not requiring image post processing on an independent workstation and was normal.  LEFT VENTRICLE PLAX 2D LVIDd:         4.50 cm     Diastology LVIDs:         2.50 cm     LV e' medial:    8.16 cm/s LV PW:         1.00 cm     LV E/e' medial:  10.7 LV IVS:        0.90 cm     LV e' lateral:   11.70 cm/s LVOT diam:     2.10 cm     LV E/e' lateral: 7.5 LV SV:         80 LV SV Index:   47 LVOT Area:     3.46 cm  LV Volumes (MOD) LV vol d, MOD A2C: 52.1 ml LV vol d, MOD A4C: 51.5 ml LV vol s, MOD A2C: 15.9 ml LV vol s, MOD A4C: 16.2 ml LV SV MOD A2C:     36.2 ml LV SV MOD A4C:     51.5 ml LV SV MOD BP:      35.5 ml RIGHT VENTRICLE RV Basal diam:  4.10 cm RV Mid diam:    3.00 cm RV S prime:     9.90 cm/s  TAPSE (M-mode): 1.9 cm LEFT ATRIUM             Index        RIGHT ATRIUM           Index LA diam:        2.90 cm 1.71 cm/m   RA Area:     15.30 cm LA Vol (A2C):   37.8 ml 22.27 ml/m  RA Volume:   41.60 ml  24.50 ml/m LA Vol (A4C):   36.3 ml 21.38 ml/m LA Biplane Vol: 39.8 ml 23.44 ml/m  AORTIC VALVE                    PULMONIC VALVE AV Area (Vmax):    2.78 cm     PV Vmax:        0.96 m/s AV Area (Vmean):   2.79 cm     PV Vmean:       67.800 cm/s AV Area (VTI):     2.97 cm     PV VTI:         0.198 m AV Vmax:           141.00 cm/s  PV Peak grad:   3.7 mmHg AV Vmean:  94.500 cm/s  PV Mean grad:   2.0 mmHg AV VTI:            0.271 m      RVOT Peak grad: 3 mmHg AV Peak Grad:      8.0 mmHg AV Mean Grad:      4.0 mmHg LVOT Vmax:         113.00 cm/s LVOT Vmean:        76.000 cm/s LVOT VTI:          0.232 m LVOT/AV VTI ratio: 0.86 AI PHT:            300 msec  AORTA Ao Root diam: 2.70 cm Ao Asc diam:  2.60 cm MITRAL VALVE MV Area (PHT): 3.85 cm     SHUNTS MV Area VTI:   3.01 cm     Systemic VTI:  0.23 m MV Peak grad:  5.6 mmHg     Systemic Diam: 2.10 cm MV Mean grad:  2.0 mmHg     Pulmonic VTI:  0.189 m MV Vmax:       1.18 m/s MV Vmean:      67.3 cm/s MV Decel Time: 197 msec MV E velocity: 87.50 cm/s MV A velocity: 105.00 cm/s MV E/A ratio:  0.83 Evalene Lunger MD Electronically signed by Evalene Lunger MD Signature Date/Time: 04/19/2024/1:26:00 PM    Final (Updated)    US  Venous Img Lower Bilateral (DVT) Result Date: 04/19/2024 EXAM: ULTRASOUND DUPLEX OF THE BILATERAL LOWER EXTREMITY VEINS TECHNIQUE: Duplex ultrasound using B-mode/gray scaled imaging and Doppler spectral analysis and color flow was obtained of the deep venous structures of the bilateral lower extremity. COMPARISON: None. CLINICAL HISTORY: [id number] Pulmonary embolism (HCC) [id number]. Pulmonary embolism (HCC). FINDINGS: The visualized veins of the lower extremity are patent and free of echogenic thrombus. The veins demonstrate good  compressibility with normal color flow study and spectral analysis. IMPRESSION: 1. No evidence of DVT. Electronically signed by: Norman Gatlin MD 04/19/2024 12:53 AM EDT RP Workstation: [id number]   CT Angio Chest PE W and/or Wo Contrast Result Date: 04/18/2024 CLINICAL DATA:  Left flank pain since yesterday, pain with inspiration EXAM: CT ANGIOGRAPHY CHEST WITH CONTRAST TECHNIQUE: Multidetector CT imaging of the chest was performed using the standard protocol during bolus administration of intravenous contrast. Multiplanar CT image reconstructions and MIPs were obtained to evaluate the vascular anatomy. RADIATION DOSE REDUCTION: This exam was performed according to the departmental dose-optimization program which includes automated exposure control, adjustment of the mA and/or kV according to patient size and/or use of iterative reconstruction technique. CONTRAST:  75mL OMNIPAQUE  IOHEXOL  350 MG/ML SOLN COMPARISON:  04/18/2024 FINDINGS: Cardiovascular: This is a technically adequate evaluation of the pulmonary vasculature. The acute bilateral lower lobe pulmonary emboli seen on earlier abdominal CT are again identified. No evidence of right heart strain. The heart is unremarkable without pericardial effusion. No evidence of thoracic aortic aneurysm or dissection. Mediastinum/Nodes: No enlarged mediastinal, hilar, or axillary lymph nodes. Thyroid gland, trachea, and esophagus demonstrate no significant findings. Small hiatal hernia. Lungs/Pleura: Bibasilar hypoventilatory changes. Trace left pleural effusion. No airspace disease or pneumothorax. Central airways are patent. Upper Abdomen: No acute abnormality. Musculoskeletal: No acute or destructive bony abnormalities. Reconstructed images demonstrate no additional findings. Review of the MIP images confirms the above findings. IMPRESSION: 1. Bilateral lower lobe pulmonary emboli as seen on earlier abdominal CT. No right heart strain. 2. Trace left pleural  effusion. 3. Small hiatal hernia. Electronically Signed   By: Ozell Delores HERO.D.  On: 04/18/2024 22:49   CT ABDOMEN PELVIS W CONTRAST Result Date: 04/18/2024 CLINICAL DATA:  Left lower quadrant pain EXAM: CT ABDOMEN AND PELVIS WITH CONTRAST TECHNIQUE: Multidetector CT imaging of the abdomen and pelvis was performed using the standard protocol following bolus administration of intravenous contrast. RADIATION DOSE REDUCTION: This exam was performed according to the departmental dose-optimization program which includes automated exposure control, adjustment of the mA and/or kV according to patient size and/or use of iterative reconstruction technique. CONTRAST:  OMNIPAQUE  IOHEXOL  300 MG/ML  SOLN COMPARISON:  CT 01/10/2024 FINDINGS: Lower chest: Lung bases demonstrate mild left pleural effusion and thickening. Atelectasis at the lower lobes. Partial and slightly bandlike left lower lobe consolidation. Incompletely visualized filling defects within the left greater than right lower lobe pulmonary arteries consistent with acute pulmonary emboli. Small hiatal hernia Hepatobiliary: Hepatic steatosis. No calcified gallstone or biliary dilatation Pancreas: Unremarkable. No pancreatic ductal dilatation or surrounding inflammatory changes. Spleen: Normal in size without focal abnormality. Adrenals/Urinary Tract: Adrenal glands are normal. Kidneys show no hydronephrosis. The bladder is unremarkable Stomach/Bowel: Stomach is within normal limits. Appendix appears normal. No evidence of bowel wall thickening, distention, or inflammatory changes. Vascular/Lymphatic: Aortic atherosclerosis. No enlarged abdominal or pelvic lymph nodes. Reproductive: Status post hysterectomy. No adnexal masses. Other: Negative for free air. No ascites. Small fat containing umbilical hernia Musculoskeletal: Posterior lumbar spinal fusion hardware, inter body device and laminectomy changes at L1-L2. Limited assessment due to hardware  artifact. Some stranding within the posterior subcutaneous paraspinal tissues likely operative in nature, no gross rim enhancing fluid collections. IMPRESSION: 1. Incompletely visualized filling defects within the left greater than right lower lobe pulmonary arteries consistent with acute pulmonary emboli. 2. Mild left pleural effusion and thickening with atelectasis at the lower lobes. Partial and slightly bandlike left lower lobe consolidation with hazy posterior density, favor atelectasis with component of pulmonary infarct not excluded. 3. No CT evidence for acute intra-abdominal or pelvic abnormality. 4. Hepatic steatosis. 5. Posterior lumbar spinal fusion hardware, inter body device and laminectomy changes at L1-L2. Limited assessment due to hardware artifact. Some stranding within the posterior subcutaneous paraspinal tissues likely operative in nature, no gross rim enhancing fluid collections. 6. Aortic atherosclerosis. Critical Value/emergent results were called by telephone at the time of interpretation on 04/18/2024 at 10:19 pm to provider PHILLIP STAFFORD , who verbally acknowledged these results. Aortic Atherosclerosis (ICD10-I70.0). Electronically Signed   By: Luke Bun M.D.   On: 04/18/2024 22:19    Disposition Plan & Communication  Patient status: Observation  Admitted From: {From:[id number]} Planned disposition location: {PLAN; DISPOSITION:26386} Anticipated discharge date: *** pending ***  Family Communication: ***    Author: Marien LITTIE [name], DO [facility] 04/20/2024, 8:03 AM   Available by Epic secure chat 7AM-7PM. If 7PM-7AM, please contact night-coverage.  [facility] contact information found on [email or web address].

## 2024-04-20 NOTE — Plan of Care (Signed)
   Problem: Activity: Goal: Risk for activity intolerance will decrease Outcome: Progressing

## 2024-04-20 NOTE — Care Management Obs Status (Signed)
 MEDICARE OBSERVATION STATUS NOTIFICATION   Patient Details  Name: Melody Harding MRN: [id number] Date of Birth: September 24, 1950   Medicare Observation Status Notification Given:  Yes    Rojelio SHAUNNA Rattler 04/20/2024, 9:48 AM

## 2024-04-20 NOTE — Progress Notes (Signed)
 Patient expressed understanding of D/c instructions. Husband at bedside and went to retrieve car. Bilat Ivs removed and volunteers called for transport.

## 2024-04-20 NOTE — TOC Transition Note (Signed)
 Transition of Care [facility]) - Discharge Note   Patient Details  Name: Melody Harding MRN: [id number] Date of Birth: 18-Dec-1950  [facility] CM/SW Contact:  Victory Jackquline RAMAN, RN Phone Number: 04/20/2024, 10:18 AM   Clinical Narrative:    RNCM met with patient and husband at the bedside. Introduced myself and discussed discharge recommendations. PT recommends that the patient resume Outpatient PT with [facility]. Patient and husband in agreement. Husband states that he stop by [facility] this morning prior to coming to hospital to cancel her appointment for today and states that she has appointments schedule for a couple of weeks out. I spoke with [facility] @ [phone] and they confirmed that they have authorization for her and they don't need any additional information from us . Husband will be transport her home. Patient has discharge orders for today. RNCM signing off.   Final next level of care: Home/Self Care Barriers to Discharge: No Barriers Identified   Patient Goals and CMS Choice            Discharge Placement                Patient to be transferred to facility by: Husband   Patient and family notified of of transfer: 04/20/24  Discharge Plan and Services Additional resources added to the After Visit Summary for                                       Social Drivers of Health (SDOH) Interventions SDOH Screenings   Food Insecurity: No Food Insecurity (04/19/2024)  Housing: Low Risk  (04/19/2024)  Transportation Needs: No Transportation Needs (04/19/2024)  Utilities: Not At Risk (04/19/2024)  Financial Resource Strain: Low Risk  (10/12/2023)   Received from [facility]  Social Connections: Unknown (04/19/2024)  Tobacco Use: Low Risk  (04/18/2024)     Readmission Risk Interventions     No data to display

## 2024-05-29 ENCOUNTER — Encounter: Payer: Self-pay | Admitting: Dermatology

## 2024-05-29 ENCOUNTER — Ambulatory Visit: Admitting: Dermatology

## 2024-05-29 DIAGNOSIS — Z1283 Encounter for screening for malignant neoplasm of skin: Secondary | ICD-10-CM

## 2024-05-29 DIAGNOSIS — C4492 Squamous cell carcinoma of skin, unspecified: Secondary | ICD-10-CM

## 2024-05-29 DIAGNOSIS — L089 Local infection of the skin and subcutaneous tissue, unspecified: Secondary | ICD-10-CM

## 2024-05-29 DIAGNOSIS — Z7189 Other specified counseling: Secondary | ICD-10-CM

## 2024-05-29 DIAGNOSIS — L821 Other seborrheic keratosis: Secondary | ICD-10-CM | POA: Diagnosis not present

## 2024-05-29 DIAGNOSIS — L820 Inflamed seborrheic keratosis: Secondary | ICD-10-CM | POA: Diagnosis not present

## 2024-05-29 DIAGNOSIS — C44329 Squamous cell carcinoma of skin of other parts of face: Secondary | ICD-10-CM | POA: Diagnosis not present

## 2024-05-29 DIAGNOSIS — L578 Other skin changes due to chronic exposure to nonionizing radiation: Secondary | ICD-10-CM | POA: Diagnosis not present

## 2024-05-29 DIAGNOSIS — D1801 Hemangioma of skin and subcutaneous tissue: Secondary | ICD-10-CM

## 2024-05-29 DIAGNOSIS — Z8589 Personal history of malignant neoplasm of other organs and systems: Secondary | ICD-10-CM

## 2024-05-29 DIAGNOSIS — W908XXA Exposure to other nonionizing radiation, initial encounter: Secondary | ICD-10-CM | POA: Diagnosis not present

## 2024-05-29 DIAGNOSIS — Z79899 Other long term (current) drug therapy: Secondary | ICD-10-CM

## 2024-05-29 DIAGNOSIS — L814 Other melanin hyperpigmentation: Secondary | ICD-10-CM

## 2024-05-29 DIAGNOSIS — D229 Melanocytic nevi, unspecified: Secondary | ICD-10-CM

## 2024-05-29 DIAGNOSIS — Z85828 Personal history of other malignant neoplasm of skin: Secondary | ICD-10-CM

## 2024-05-29 MED ORDER — MUPIROCIN 2 % EX OINT: TOPICAL_OINTMENT | CUTANEOUS | 3 refills | Status: DC

## 2024-05-29 MED ORDER — MUPIROCIN 2 % EX OINT: TOPICAL_OINTMENT | CUTANEOUS | 1 refills | Status: AC

## 2024-05-29 NOTE — Patient Instructions (Addendum)

## 2024-05-29 NOTE — Progress Notes (Signed)
 Follow-Up Visit   Subjective  Melody Harding is a 73 y.o. 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 Pt had to cancel appts to treat due to other medical issues.  The patient presents for Total-Body Skin Exam (TBSE) for skin cancer screening and mole check. The patient has spots, moles and lesions to be evaluated, some may be new or changing and the patient may have concern these could be cancer.  The following portions of the chart were reviewed this encounter and updated as appropriate: medications, allergies, medical history  Review of Systems:  No other skin or systemic complaints except as noted in HPI or Assessment and Plan.  Objective  Well appearing patient in no apparent distress; mood and affect are within normal limits.  A full examination was performed including scalp, head, eyes, ears, nose, lips, neck, chest, axillae, abdomen, back, buttocks, bilateral upper extremities, bilateral lower extremities, hands, feet, fingers, toes, fingernails, and toenails. All findings within normal limits unless otherwise noted below.   Relevant physical exam findings are noted in the Assessment and Plan.  Right nose alar rim x 1, left zygoma/near sideburn x1.  Return if not resolved after 2 months.  Otherwise will re-check in clinic in 3 mos. (2) Stuck-on, waxy, tan-brown papules and plaques -- Discussed benign etiology and prognosis.  right side burn preauricular Pink patch.  No obvious residual Biopsy proven SCC by exam, but only shave removal done.    Assessment & Plan   SKIN CANCER SCREENING PERFORMED TODAY.  ACTINIC DAMAGE - Chronic condition, secondary to cumulative UV/sun exposure - diffuse scaly erythematous macules with underlying dyspigmentation - Recommend daily broad spectrum sunscreen SPF 30+ to sun-exposed areas, reapply every 2 hours as needed.  - Staying in  the shade or wearing long sleeves, sun glasses (UVA+UVB protection) and wide brim hats (4-inch brim around the entire circumference of the hat) are also recommended for sun protection.  - Call for new or changing lesions.  LENTIGINES, SEBORRHEIC KERATOSES, HEMANGIOMAS - Benign normal skin lesions - Benign-appearing - Call for any changes  MELANOCYTIC NEVI - Tan-brown and/or pink-flesh-colored symmetric macules and papules - Benign appearing on exam today - Observation - Call clinic for new or changing moles - Recommend daily use of broad spectrum spf 30+ sunscreen to sun-exposed areas.   HISTORY OF BASAL CELL CARCINOMA OF THE SKIN - No evidence of recurrence today - Recommend regular full body skin exams - Recommend daily broad spectrum sunscreen SPF 30+ to sun-exposed areas, reapply every 2 hours as needed.  - Call if any new or changing lesions are noted between office visits   HISTORY OF SQUAMOUS CELL CARCINOMA OF THE SKIN - No evidence of recurrence today - No lymphadenopathy - Recommend regular full body skin exams - Recommend daily broad spectrum sunscreen SPF 30+ to sun-exposed areas, reapply every 2 hours as needed.  - Call if any new or changing lesions are noted between office visits  INFLAMED SEBORRHEIC KERATOSIS (2) Right nose alar rim x 1, left zygoma/near sideburn x1.  Return if not resolved after 2 months.  Otherwise will re-check in clinic in 3 mos. (2) Symptomatic, irritating, patient would like treated.   If not gone at the next office visit may consider biopsy  Destruction of lesion - Right nose alar rim x 1, left zygoma/near sideburn x1.  Return if not resolved after 2 months.  Otherwise  will re-check in clinic in 3 mos. (2) Complexity: simple   Destruction method: cryotherapy   Informed consent: discussed and consent obtained   Timeout:  patient name, date of birth, surgical site, and procedure verified Lesion destroyed using liquid nitrogen: Yes   Region  frozen until ice ball extended beyond lesion: Yes   Outcome: patient tolerated procedure well with no complications   Post-procedure details: wound care instructions given    SQUAMOUS CELL CARCINOMA OF SKIN right side burn preauricular Destruction of lesion Complexity: extensive   Destruction method: cryotherapy   Informed consent: discussed and consent obtained   Timeout:  patient name, date of birth, surgical site, and procedure verified Patient was prepped and draped in usual sterile fashion: area prepped with alcohol. Final wound size (cm):  1.1 Outcome: patient tolerated procedure well with no complications   Post-procedure details: wound care instructions given   Post-procedure details comment:  Ointment and small bandage applied  HISTORY OF BASAL CELL CARCINOMA   HISTORY OF SQUAMOUS CELL CARCINOMA   SKIN CANCER SCREENING   LENTIGO   MELANOCYTIC NEVUS, UNSPECIFIED LOCATION   PUSTULES DETERMINED BY EXAMINATION   COUNSELING AND COORDINATION OF CARE   MEDICATION MANAGEMENT     Pustules x 3 left arm and right arm  Start Mupirocin  ointment apply to arms qd-bid    Return in about 3 months (around 08/29/2024).  IFay Kirks, CMA, am acting as scribe for Alm Rhyme, MD .   Documentation: I have reviewed the above documentation for accuracy and completeness, and I agree with the above.  Alm Rhyme, MD

## 2024-06-07 ENCOUNTER — Other Ambulatory Visit (HOSPITAL_COMMUNITY): Payer: Self-pay

## 2024-08-28 ENCOUNTER — Encounter: Payer: Self-pay | Admitting: Dermatology

## 2024-08-28 ENCOUNTER — Ambulatory Visit: Admitting: Dermatology

## 2024-08-28 DIAGNOSIS — L821 Other seborrheic keratosis: Secondary | ICD-10-CM

## 2024-08-28 DIAGNOSIS — Z79899 Other long term (current) drug therapy: Secondary | ICD-10-CM

## 2024-08-28 DIAGNOSIS — Z85828 Personal history of other malignant neoplasm of skin: Secondary | ICD-10-CM | POA: Diagnosis not present

## 2024-08-28 DIAGNOSIS — L209 Atopic dermatitis, unspecified: Secondary | ICD-10-CM | POA: Diagnosis not present

## 2024-08-28 DIAGNOSIS — L853 Xerosis cutis: Secondary | ICD-10-CM | POA: Diagnosis not present

## 2024-08-28 DIAGNOSIS — L578 Other skin changes due to chronic exposure to nonionizing radiation: Secondary | ICD-10-CM | POA: Diagnosis not present

## 2024-08-28 DIAGNOSIS — L820 Inflamed seborrheic keratosis: Secondary | ICD-10-CM | POA: Diagnosis not present

## 2024-08-28 DIAGNOSIS — Z8589 Personal history of malignant neoplasm of other organs and systems: Secondary | ICD-10-CM

## 2024-08-28 DIAGNOSIS — W908XXA Exposure to other nonionizing radiation, initial encounter: Secondary | ICD-10-CM | POA: Diagnosis not present

## 2024-08-28 DIAGNOSIS — Z7189 Other specified counseling: Secondary | ICD-10-CM

## 2024-08-28 MED ORDER — EUCRISA 2 % EX OINT: TOPICAL_OINTMENT | CUTANEOUS | 11 refills | Status: AC

## 2024-08-28 NOTE — Progress Notes (Signed)
 "  Follow-Up Visit   Subjective  Melody Harding is a 74 y.o. female who presents for the following: 3 month follow up on isk  Patient has hx of scc at right side burn preauricular   The patient has spots, moles and lesions to be evaluated, some may be new or changing and the patient may have concern these could be cancer.  The following portions of the chart were reviewed this encounter and updated as appropriate: medications, allergies, medical history  Review of Systems:  No other skin or systemic complaints except as noted in HPI or Assessment and Plan.  Objective  Well appearing patient in no apparent distress; mood and affect are within normal limits.    A focused examination was performed of the following areas: Face, left leg  Relevant exam findings are noted in the Assessment and Plan.  right temple x 5, right cheek x 1, right sideburn x 1, left temple x 5, left cheek  x 1, left sideburn x 1, left preauricular x 1, right ankle x 1, left lower leg x 1 (17) Erythematous stuck-on, waxy papule or plaque  Assessment & Plan   ATOPIC DERMATITIS With xerosis Exam: Scaly pink papules coalescing to plaques at legs 6% BSA Chronic and persistent condition with duration or expected duration over one year. Condition is symptomatic/ bothersome to patient. Not currently at goal. Atopic dermatitis (eczema) is a chronic, relapsing, pruritic condition that can significantly affect quality of life. It is often associated with allergic rhinitis and/or asthma and can require treatment with topical medications, phototherapy, or in severe cases biologic injectable medication (Dupixent; Adbry) or Oral JAK inhibitors.  Treatment Plan: Start Eucrisa  2 % ointment - apply to pink irritated spots on body daily for eczema Recommend Cerave moisturizing cream  as a daily moisturizer.  Recommend gentle skin care.   HISTORY OF SQUAMOUS CELL CARCINOMA OF THE SKIN 10/31/2023 Right side burn preauricular  trx with ED&C  - No evidence of recurrence today - No lymphadenopathy - Recommend regular full body skin exams - Recommend daily broad spectrum sunscreen SPF 30+ to sun-exposed areas, reapply every 2 hours as needed.  - Call if any new or changing lesions are noted between office visits   SEBORRHEIC KERATOSIS - Stuck-on, waxy, tan-brown papules and/or plaques  - Benign-appearing - Discussed benign etiology and prognosis. - Observe - Call for any changes   ACTINIC DAMAGE - chronic, secondary to cumulative UV radiation exposure/sun exposure over time - diffuse scaly erythematous macules with underlying dyspigmentation - Recommend daily broad spectrum sunscreen SPF 30+ to sun-exposed areas, reapply every 2 hours as needed.  - Recommend staying in the shade or wearing long sleeves, sun glasses (UVA+UVB protection) and wide brim hats (4-inch brim around the entire circumference of the hat). - Call for new or changing lesion INFLAMED SEBORRHEIC KERATOSIS (17) right temple x 5, right cheek x 1, right sideburn x 1, left temple x 5, left cheek  x 1, left sideburn x 1, left preauricular x 1, right ankle x 1, left lower leg x 1 (17) Symptomatic, irritating, patient would like treated. - Destruction of lesion - right temple x 5, right cheek x 1, right sideburn x 1, left temple x 5, left cheek  x 1, left sideburn x 1, left preauricular x 1, right ankle x 1, left lower leg x 1 (17) Complexity: simple   Destruction method: cryotherapy   Informed consent: discussed and consent obtained   Timeout:  patient name, date  of birth, surgical site, and procedure verified Lesion destroyed using liquid nitrogen: Yes   Region frozen until ice ball extended beyond lesion: Yes   Outcome: patient tolerated procedure well with no complications   Post-procedure details: wound care instructions given    ATOPIC DERMATITIS, UNSPECIFIED TYPE   This Visit - Crisaborole  (EUCRISA ) 2 % OINT - Apply to pink itchy  areas on body for eczema daily  Return in about 6 months (around 02/25/2025) for tbse, hx of scc, hx of bcc, hx of isks .  IEleanor Blush, CMA, am acting as scribe for Alm Rhyme, MD.   Documentation: I have reviewed the above documentation for accuracy and completeness, and I agree with the above.  Alm Rhyme, MD    "

## 2024-08-28 NOTE — Patient Instructions (Addendum)
 For eczema at body  Start Eucrisa  cream - apply to any pink or irritated areas daily on body for eczema   Recommend cerave moisturizing cream as a daily moisturizer    Gentle Skin Care Guide  1. Bathe no more than once a day.  2. Avoid bathing in hot water  3. Use a mild soap like Dove, Vanicream, Cetaphil, CeraVe. Can use Lever 2000 or Cetaphil antibacterial soap  4. Use soap only where you need it. On most days, use it under your arms, between your legs, and on your feet. Let the water rinse other areas unless visibly dirty.  5. When you get out of the bath/shower, use a towel to gently blot your skin dry, don't rub it.  6. While your skin is still a little damp, apply a moisturizing cream such as Vanicream, CeraVe, Cetaphil, Eucerin, Sarna lotion or plain Vaseline Jelly. For hands apply Neutrogena Norwegian Hand Cream or Excipial Hand Cream.  7. Reapply moisturizer any time you start to itch or feel dry.  8. Sometimes using free and clear laundry detergents can be helpful. Fabric softener sheets should be avoided. Downy Free & Gentle liquid, or any liquid fabric softener that is free of dyes and perfumes, it acceptable to use  9. If your doctor has given you prescription creams you may apply moisturizers over them     Seborrheic Keratosis  What causes seborrheic keratoses? Seborrheic keratoses are harmless, common skin growths that first appear during adult life.  As time goes by, more growths appear.  Some people may develop a large number of them.  Seborrheic keratoses appear on both covered and uncovered body parts.  They are not caused by sunlight.  The tendency to develop seborrheic keratoses can be inherited.  They vary in color from skin-colored to gray, brown, or even black.  They can be either smooth or have a rough, warty surface.   Seborrheic keratoses are superficial and look as if they were stuck on the skin.  Under the microscope this type of keratosis looks like  layers upon layers of skin.  That is why at times the top layer may seem to fall off, but the rest of the growth remains and re-grows.    Treatment Seborrheic keratoses do not need to be treated, but can easily be removed in the office.  Seborrheic keratoses often cause symptoms when they rub on clothing or jewelry.  Lesions can be in the way of shaving.  If they become inflamed, they can cause itching, soreness, or burning.  Removal of a seborrheic keratosis can be accomplished by freezing, burning, or surgery. If any spot bleeds, scabs, or grows rapidly, please return to have it checked, as these can be an indication of a skin cancer.   Cryotherapy Aftercare  Wash gently with soap and water everyday.   Apply Vaseline and Band-Aid daily until healed.   Due to recent changes in healthcare laws, you may see results of your pathology and/or laboratory studies on MyChart before the doctors have had a chance to review them. We understand that in some cases there may be results that are confusing or concerning to you. Please understand that not all results are received at the same time and often the doctors may need to interpret multiple results in order to provide you with the best plan of care or course of treatment. Therefore, we ask that you please give us  2 business days to thoroughly review all your results before contacting the  office for clarification. Should we see a critical lab result, you will be contacted sooner.   If You Need Anything After Your Visit  If you have any questions or concerns for your doctor, please call our main line at [phone] and press option 4 to reach your doctor's medical assistant. If no one answers, please leave a voicemail as directed and we will return your call as soon as possible. Messages left after 4 pm will be answered the following business day.   You may also send us  a message via MyChart. We typically respond to MyChart messages within 1-2 business  days.  For prescription refills, please ask your pharmacy to contact our office. Our fax number is [phone].  If you have an urgent issue when the clinic is closed that cannot wait until the next business day, you can page your doctor at the number below.    Please note that while we do our best to be available for urgent issues outside of office hours, we are not available 24/7.   If you have an urgent issue and are unable to reach us , you may choose to seek medical care at your doctor's office, retail clinic, urgent care center, or emergency room.  If you have a medical emergency, please immediately call 911 or go to the emergency department.  Pager Numbers  - Dr. Hester: [phone]  - Dr. Jackquline: [phone]  - Dr. Claudene: [phone]   - Dr. Raymund: [phone]  In the event of inclement weather, please call our main line at [phone] for an update on the status of any delays or closures.  Dermatology Medication Tips: Please keep the boxes that topical medications come in in order to help keep track of the instructions about where and how to use these. Pharmacies typically print the medication instructions only on the boxes and not directly on the medication tubes.   If your medication is too expensive, please contact our office at [phone] option 4 or send us  a message through MyChart.   We are unable to tell what your co-pay for medications will be in advance as this is different depending on your insurance coverage. However, we may be able to find a substitute medication at lower cost or fill out paperwork to get insurance to cover a needed medication.   If a prior authorization is required to get your medication covered by your insurance company, please allow us  1-2 business days to complete this process.  Drug prices often vary depending on where the prescription is filled and some pharmacies may offer cheaper prices.  The website www.goodrx.com contains  coupons for medications through different pharmacies. The prices here do not account for what the cost may be with help from insurance (it may be cheaper with your insurance), but the website can give you the price if you did not use any insurance.  - You can print the associated coupon and take it with your prescription to the pharmacy.  - You may also stop by our office during regular business hours and pick up a GoodRx coupon card.  - If you need your prescription sent electronically to a different pharmacy, notify our office through [facility] or by phone at [phone] option 4.     Si Usted Necesita Algo Despus de Su Visita  Tambin puede enviarnos un mensaje a travs de Clinical Cytogeneticist. Por lo general respondemos a los mensajes de MyChart en el transcurso de 1 a 2 das hbiles.  Para renovar recetas,  por favor pida a su farmacia que se ponga en contacto con nuestra oficina. Randi lakes de fax es [place] [phone].  Si tiene un asunto urgente cuando la clnica est cerrada y que no puede esperar hasta el siguiente da hbil, puede llamar/localizar a su doctor(a) al nmero que aparece a continuacin.   Por favor, tenga en cuenta que aunque hacemos todo lo posible para estar disponibles para asuntos urgentes fuera del horario de [place], no estamos disponibles las 24 horas del da, los 7 [address]  [address] de la [place].   Si tiene un problema urgente y no puede comunicarse con nosotros, puede optar por buscar atencin mdica  en el consultorio de su doctor(a), en una clnica privada, en un centro de atencin urgente o en una sala de emergencias.  Si tiene engineer, drilling, por favor llame inmediatamente al 911 o vaya a la sala de emergencias.  Nmeros de bper  - Dr. Hester: [phone]  - Dra. Jackquline: [phone]  - Dr. Claudene: [phone]  - Dra. Kitts: [phone]  En caso de inclemencias del [place], por favor llame a nuestra lnea principal al [phone] para una  actualizacin sobre el estado de cualquier retraso o cierre.  Consejos para la medicacin en dermatologa: Por favor, guarde las cajas en las que vienen los medicamentos de uso tpico para ayudarle a seguir las instrucciones sobre dnde y cmo usarlos. Las farmacias generalmente imprimen las instrucciones del medicamento slo en las cajas y no directamente en los tubos del [place].   Si su medicamento es muy caro, por favor, pngase en contacto con landry rieger llamando al [phone] y presione la opcin 4 o envenos un mensaje a travs de Clinical Cytogeneticist.   No podemos decirle cul ser su copago por los medicamentos por adelantado ya que esto es diferente dependiendo de la cobertura de su seguro. Sin embargo, es posible que podamos encontrar un medicamento sustituto a audiological scientist un formulario para que el seguro cubra el medicamento que se considera necesario.   Si se requiere una autorizacin previa para que su compaa de seguros cubra su medicamento, por favor permtanos de 1 a 2 das hbiles para completar este proceso.  Los precios de los medicamentos varan con frecuencia dependiendo del environmental consultant de dnde se surte la receta y alguna farmacias pueden ofrecer precios ms baratos.  El sitio web www.goodrx.com tiene cupones para medicamentos de health and safety inspector. Los precios aqu no tienen en cuenta lo que podra costar con la ayuda del seguro (puede ser ms barato con su seguro), pero el sitio web puede darle el precio si no utiliz tourist information centre manager.  - Puede imprimir el cupn correspondiente y llevarlo con su receta a la farmacia.  - Tambin puede pasar por nuestra oficina durante el horario de atencin regular y education officer, museum una tarjeta de cupones de GoodRx.  - Si necesita que su receta se enve electrnicamente a una farmacia diferente, informe a nuestra oficina a travs de MyChart de [place] o por telfono llamando al [phone] y presione la opcin 4.

## 2024-09-05 ENCOUNTER — Other Ambulatory Visit: Payer: Self-pay | Admitting: Family Medicine

## 2024-09-05 DIAGNOSIS — Z86711 Personal history of pulmonary embolism: Secondary | ICD-10-CM

## 2024-09-24 ENCOUNTER — Ambulatory Visit

## 2024-10-11 ENCOUNTER — Ambulatory Visit: Admitting: Physician Assistant

## 2025-02-25 ENCOUNTER — Ambulatory Visit: Admitting: Dermatology
